# Patient Record
Sex: Male | Born: 1950
Health system: Southern US, Community
[De-identification: ages and names within clinical notes are randomized; demographics above are authoritative.]

## PROBLEM LIST (undated history)

## (undated) DIAGNOSIS — D649 Anemia, unspecified: Secondary | ICD-10-CM

## (undated) DIAGNOSIS — J189 Pneumonia, unspecified organism: Secondary | ICD-10-CM

## (undated) DIAGNOSIS — N189 Chronic kidney disease, unspecified: Secondary | ICD-10-CM

## (undated) DIAGNOSIS — L409 Psoriasis, unspecified: Secondary | ICD-10-CM

## (undated) DIAGNOSIS — Z87442 Personal history of urinary calculi: Secondary | ICD-10-CM

## (undated) DIAGNOSIS — N200 Calculus of kidney: Secondary | ICD-10-CM

## (undated) HISTORY — DX: Anemia, unspecified: D64.9

## (undated) HISTORY — PX: HAND SURGERY: SHX662

## (undated) HISTORY — DX: Chronic kidney disease, unspecified: N18.9

## (undated) HISTORY — DX: Psoriasis, unspecified: L40.9

---

## 2001-08-03 ENCOUNTER — Ambulatory Visit (HOSPITAL_COMMUNITY): Admission: RE | Admit: 2001-08-03 | Discharge: 2001-08-03 | Payer: Self-pay | Admitting: Family Medicine

## 2001-08-03 ENCOUNTER — Encounter: Payer: Self-pay | Admitting: Family Medicine

## 2001-10-18 ENCOUNTER — Ambulatory Visit (HOSPITAL_COMMUNITY): Admission: RE | Admit: 2001-10-18 | Discharge: 2001-10-18 | Payer: Self-pay | Admitting: Family Medicine

## 2001-10-18 ENCOUNTER — Encounter: Payer: Self-pay | Admitting: Family Medicine

## 2001-12-05 ENCOUNTER — Ambulatory Visit (HOSPITAL_COMMUNITY): Admission: RE | Admit: 2001-12-05 | Discharge: 2001-12-05 | Payer: Self-pay | Admitting: Pulmonary Disease

## 2001-12-21 ENCOUNTER — Emergency Department (HOSPITAL_COMMUNITY): Admission: EM | Admit: 2001-12-21 | Discharge: 2001-12-21 | Payer: Self-pay | Admitting: Emergency Medicine

## 2003-09-17 ENCOUNTER — Ambulatory Visit (HOSPITAL_COMMUNITY): Admission: RE | Admit: 2003-09-17 | Discharge: 2003-09-17 | Payer: Self-pay | Admitting: Family Medicine

## 2003-09-17 ENCOUNTER — Encounter: Payer: Self-pay | Admitting: Family Medicine

## 2004-02-18 ENCOUNTER — Ambulatory Visit (HOSPITAL_COMMUNITY): Admission: RE | Admit: 2004-02-18 | Discharge: 2004-02-18 | Payer: Self-pay | Admitting: Family Medicine

## 2004-07-14 ENCOUNTER — Ambulatory Visit (HOSPITAL_COMMUNITY): Admission: RE | Admit: 2004-07-14 | Discharge: 2004-07-14 | Payer: Self-pay | Admitting: Family Medicine

## 2006-09-25 ENCOUNTER — Emergency Department (HOSPITAL_COMMUNITY): Admission: EM | Admit: 2006-09-25 | Discharge: 2006-09-25 | Payer: Self-pay | Admitting: Emergency Medicine

## 2006-09-27 ENCOUNTER — Ambulatory Visit (HOSPITAL_COMMUNITY): Admission: RE | Admit: 2006-09-27 | Discharge: 2006-09-27 | Payer: Self-pay | Admitting: Family Medicine

## 2007-01-19 ENCOUNTER — Emergency Department (HOSPITAL_COMMUNITY): Admission: EM | Admit: 2007-01-19 | Discharge: 2007-01-19 | Payer: Self-pay | Admitting: Emergency Medicine

## 2007-02-26 ENCOUNTER — Emergency Department (HOSPITAL_COMMUNITY): Admission: EM | Admit: 2007-02-26 | Discharge: 2007-02-26 | Payer: Self-pay | Admitting: Emergency Medicine

## 2008-08-28 ENCOUNTER — Ambulatory Visit (HOSPITAL_COMMUNITY): Admission: RE | Admit: 2008-08-28 | Discharge: 2008-08-28 | Payer: Self-pay | Admitting: Urology

## 2011-01-18 ENCOUNTER — Encounter: Payer: Self-pay | Admitting: Family Medicine

## 2011-04-10 ENCOUNTER — Telehealth: Payer: Self-pay

## 2011-04-10 NOTE — Telephone Encounter (Signed)
Take 1/2 metformin day before procedure Hold metformin day of procedure to take afterwards Bring all meds to hospital

## 2011-04-10 NOTE — Telephone Encounter (Signed)
Gastroenterology Pre-Procedure Form  Request Date: 04/02/2011,  Requesting Physician: Dr. Renard Matter     PATIENT INFORMATION:  Jacob Maldonado is a 60 y.o., male (DOB=[birth year]).  PROCEDURE: Procedure(s) requested: colonoscopy Procedure Reason: screening for colon cancer  PATIENT REVIEW QUESTIONS: The patient reports the following:   1. Diabetes Melitis: Yes 2. Joint replacements in the past 12 months: no 3. Major health problems in the past 3 months: no 4. Has an artificial valve or MVP:no 5. Has been advised in past to take antibiotics in advance of a procedure like teeth cleaning: no}    MEDICATIONS & ALLERGIES:    Patient reports the following regarding taking any blood thinners:   Plavix? no Aspirin?no Coumadin?  no  Patient confirms/reports the following medications:  Current Outpatient Prescriptions  Medication Sig Dispense Refill  . acitretin (SORIATANE) 25 MG capsule Take 25 mg by mouth 2 (two) times daily.        Marland Kitchen allopurinol (ZYLOPRIM) 100 MG tablet Take 100 mg by mouth daily.        . metFORMIN (GLUMETZA) 500 MG (MOD) 24 hr tablet Take 500 mg by mouth 2 (two) times daily with a meal.          Patient confirms/reports the following allergies:  Allergies  Allergen Reactions  . Penicillins Hives    Patient is appropriate to schedule for requested procedure(s): yes  AUTHORIZATION INFORMATION Primary Insurance: Medicare    ID #: [id number] Pre-Cert / Berkley Harvey required:   Secondary Insurance: BCBS,  ID #: [id number]  Pre-Cert / Auth required:  Pre-Cert / Auth #:  No orders of the defined types were placed in this encounter.    SCHEDULE INFORMATION: Procedure has been scheduled as follows:  Date: 04/29/2011, Time: 9:15 AM  Location: [facility] Short Stay  This Gastroenterology Pre-Precedure Form is being routed to the following provider(s) for review: Lorenza Burton, NP e

## 2011-04-13 NOTE — Telephone Encounter (Signed)
Gastroenterology Pre-Procedure Form  Request Date: 04/02/2011   Requesting Physician: Dr. Renard Matter     PATIENT INFORMATION:  Jacob Maldonado is a 60 y.o., male (DOB=12/21/1951).  PROCEDURE: Procedure(s) requested: colonoscopy Procedure Reason: screening for colon cancer  PATIENT REVIEW QUESTIONS: The patient reports the following:   1. Diabetes Melitis: Yes 2. Joint replacements in the past 12 months: no 3. Major health problems in the past 3 months: no 4. Has an artificial valve or MVP:no 5. Has been advised in past to take antibiotics in advance of a procedure like teeth cleaning: no}    MEDICATIONS & ALLERGIES:    Patient reports the following regarding taking any blood thinners:   Plavix? no Aspirin ? No  Coumadin?  no  Patient confirms/reports the following medications:  Current Outpatient Prescriptions  Medication Sig Dispense Refill  . acitretin (SORIATANE) 25 MG capsule Take 25 mg by mouth 2 (two) times daily.        Marland Kitchen allopurinol (ZYLOPRIM) 100 MG tablet Take 100 mg by mouth daily.        . metFORMIN (GLUMETZA) 500 MG (MOD) 24 hr tablet Take 500 mg by mouth 2 (two) times daily with a meal.          Patient confirms/reports the following allergies:  Allergies  Allergen Reactions  . Penicillins Hives    Patient is appropriate to schedule for requested procedure(s): yes  AUTHORIZATION INFORMATION Primary Insurance: BCBS,  ID #: [id number]  Pre-Cert / Auth required:  Pre-Cert / Authp #:     Orders Placed This Encounter  Procedures  . Endoscopy, colon, diagnostic    Standing Status: Future     Number of Occurrences:      Standing Expiration Date: 04/09/2012    Order Specific Question:  Pre-op diagnosis    Answer:  Screening colonoscopy    Order Specific Question:  Pre-op visit required?    Answer:  No [0]    SCHEDULE INFORMATION: Procedure has been scheduled as follows:  Date: 04/29/2011   Time: 9:15 AM Location: [facility] Short Stay  This  Gastroenterology Pre-Precedure Form is being routed to the following provider(s) for review:

## 2011-04-29 ENCOUNTER — Encounter: Payer: Self-pay | Admitting: Internal Medicine

## 2011-05-15 NOTE — Procedures (Signed)
[facility]  Patient:    Jacob Maldonado, Jacob Maldonado Visit Number: [id number] MRN: [id number]          Service Type: OUT Location: RAD Attending Physician:  Alice Reichert Dictated by:   Kari Baars, M.D. Admit Date:  10/18/2001 Discharge Date: 10/18/2001   CC:         Butch Penny, M.D.   Stress Test  PRIMARY CARE PHYSICIAN:  Dr. Butch Penny.  INDICATION FOR PROCEDURE:  Shortness of breath and hypertension and diabetes.  SUBJECTIVE:  Jacob Maldonado is coming to the stress lab at [facility] for evaluation of his shortness of breath.  He has a history of obesity, a history of hypertension and his diabetes.  There are no contraindications to graded exercise testing.  DESCRIPTION OF PROCEDURE:  Jacob Maldonado exercised for 4 minutes and 30 seconds on the Bruce protocol, reaching and sustaining 7.0 METs.  His maximum recorded heart rate was 162, which is 95% of his age-predicted maximal heart rate.  He had no symptoms during exercise, had no electrocardiographic changes suggestive of inducible ischemia and his blood pressure response to exercise was normal.  IMPRESSION: 1. Fair exercise tolerance. 2. No evidence of inducible ischemia. 3. Normal blood pressure response to exercise. 4. No symptoms during exercise. Dictated by:   Kari Baars, M.D. Attending Physician:  Alice Reichert DD:  12/05/01 TD:  12/05/01 Job: [id number]

## 2011-09-08 ENCOUNTER — Other Ambulatory Visit (HOSPITAL_COMMUNITY): Payer: Self-pay | Admitting: Family Medicine

## 2011-09-08 ENCOUNTER — Ambulatory Visit (HOSPITAL_COMMUNITY)
Admission: RE | Admit: 2011-09-08 | Discharge: 2011-09-08 | Disposition: A | Discharge status: Home / Self Care | Payer: Medicare Other | Source: Ambulatory Visit | Attending: Family Medicine | Admitting: Family Medicine

## 2011-09-08 DIAGNOSIS — Z87891 Personal history of nicotine dependence: Secondary | ICD-10-CM

## 2011-09-08 DIAGNOSIS — IMO0001 Reserved for inherently not codable concepts without codable children: Secondary | ICD-10-CM

## 2011-09-08 DIAGNOSIS — R918 Other nonspecific abnormal finding of lung field: Secondary | ICD-10-CM | POA: Insufficient documentation

## 2011-09-08 DIAGNOSIS — R0989 Other specified symptoms and signs involving the circulatory and respiratory systems: Secondary | ICD-10-CM | POA: Insufficient documentation

## 2012-02-03 ENCOUNTER — Other Ambulatory Visit: Payer: Self-pay

## 2012-02-03 ENCOUNTER — Emergency Department (HOSPITAL_COMMUNITY): Payer: Medicare Other

## 2012-02-03 ENCOUNTER — Encounter (HOSPITAL_COMMUNITY): Payer: Self-pay | Admitting: Emergency Medicine

## 2012-02-03 ENCOUNTER — Observation Stay (HOSPITAL_COMMUNITY)
Admission: EM | Admit: 2012-02-03 | Discharge: 2012-02-04 | DRG: 313 | Disposition: A | Discharge status: Home / Self Care | Payer: Medicare Other | Attending: Family Medicine | Admitting: Family Medicine

## 2012-02-03 DIAGNOSIS — K7460 Unspecified cirrhosis of liver: Secondary | ICD-10-CM | POA: Diagnosis present

## 2012-02-03 DIAGNOSIS — I4460 Unspecified fascicular block: Secondary | ICD-10-CM | POA: Diagnosis present

## 2012-02-03 DIAGNOSIS — N189 Chronic kidney disease, unspecified: Secondary | ICD-10-CM | POA: Diagnosis present

## 2012-02-03 DIAGNOSIS — Z87891 Personal history of nicotine dependence: Secondary | ICD-10-CM

## 2012-02-03 DIAGNOSIS — Z880 Allergy status to penicillin: Secondary | ICD-10-CM

## 2012-02-03 DIAGNOSIS — Z6841 Body Mass Index (BMI) 40.0 and over, adult: Secondary | ICD-10-CM

## 2012-02-03 DIAGNOSIS — I517 Cardiomegaly: Secondary | ICD-10-CM | POA: Diagnosis present

## 2012-02-03 DIAGNOSIS — M549 Dorsalgia, unspecified: Secondary | ICD-10-CM | POA: Diagnosis present

## 2012-02-03 DIAGNOSIS — R7989 Other specified abnormal findings of blood chemistry: Secondary | ICD-10-CM | POA: Diagnosis present

## 2012-02-03 DIAGNOSIS — Z23 Encounter for immunization: Secondary | ICD-10-CM

## 2012-02-03 DIAGNOSIS — Z79899 Other long term (current) drug therapy: Secondary | ICD-10-CM

## 2012-02-03 DIAGNOSIS — E119 Type 2 diabetes mellitus without complications: Secondary | ICD-10-CM | POA: Diagnosis present

## 2012-02-03 DIAGNOSIS — R911 Solitary pulmonary nodule: Secondary | ICD-10-CM | POA: Diagnosis present

## 2012-02-03 DIAGNOSIS — J189 Pneumonia, unspecified organism: Secondary | ICD-10-CM | POA: Diagnosis present

## 2012-02-03 DIAGNOSIS — R161 Splenomegaly, not elsewhere classified: Secondary | ICD-10-CM | POA: Diagnosis present

## 2012-02-03 DIAGNOSIS — G8929 Other chronic pain: Secondary | ICD-10-CM | POA: Diagnosis present

## 2012-02-03 DIAGNOSIS — Z87442 Personal history of urinary calculi: Secondary | ICD-10-CM

## 2012-02-03 DIAGNOSIS — G4730 Sleep apnea, unspecified: Secondary | ICD-10-CM | POA: Diagnosis present

## 2012-02-03 DIAGNOSIS — N200 Calculus of kidney: Secondary | ICD-10-CM

## 2012-02-03 DIAGNOSIS — R0789 Other chest pain: Principal | ICD-10-CM | POA: Diagnosis present

## 2012-02-03 DIAGNOSIS — L408 Other psoriasis: Secondary | ICD-10-CM | POA: Diagnosis present

## 2012-02-03 DIAGNOSIS — R079 Chest pain, unspecified: Secondary | ICD-10-CM

## 2012-02-03 HISTORY — DX: Personal history of urinary calculi: Z87.442

## 2012-02-03 HISTORY — DX: Calculus of kidney: N20.0

## 2012-02-03 HISTORY — DX: Pneumonia, unspecified organism: J18.9

## 2012-02-03 LAB — CBC
Hemoglobin: 14 g/dL (ref 13.0–17.0)
MCH: 31 pg (ref 26.0–34.0)
MCHC: 34.1 g/dL (ref 30.0–36.0)
MCV: 90.9 fL (ref 78.0–100.0)
Platelets: 166 10*3/uL (ref 150–400)

## 2012-02-03 LAB — POCT I-STAT TROPONIN I: Troponin i, poc: 0 ng/mL (ref 0.00–0.08)

## 2012-02-03 LAB — BASIC METABOLIC PANEL
CO2: 22 mEq/L (ref 19–32)
Calcium: 9.7 mg/dL (ref 8.4–10.5)
Creatinine, Ser: 1.36 mg/dL — ABNORMAL HIGH (ref 0.50–1.35)
GFR calc non Af Amer: 55 mL/min — ABNORMAL LOW (ref >90)
Glucose, Bld: 358 mg/dL — ABNORMAL HIGH (ref 70–99)
Sodium: 133 mEq/L — ABNORMAL LOW (ref 135–145)

## 2012-02-03 LAB — CARDIAC PANEL(CRET KIN+CKTOT+MB+TROPI)
CK, MB: 1.9 ng/mL (ref 0.3–4.0)
Troponin I: <0.3 ng/mL (ref <0.30)

## 2012-02-03 LAB — GLUCOSE, CAPILLARY

## 2012-02-03 MED ORDER — ONDANSETRON HCL 4 MG/2ML IJ SOLN
4.0000 mg | Freq: Once | INTRAMUSCULAR | Status: AC
  Administered 2012-02-03: 4 mg via INTRAVENOUS
  Filled 2012-02-03: qty 2

## 2012-02-03 MED ORDER — PANTOPRAZOLE SODIUM 40 MG IV SOLR
40.0000 mg | Freq: Once | INTRAVENOUS | Status: AC
  Administered 2012-02-03: 40 mg via INTRAVENOUS
  Filled 2012-02-03: qty 40

## 2012-02-03 MED ORDER — ENOXAPARIN SODIUM 40 MG/0.4ML ~~LOC~~ SOLN
40.0000 mg | SUBCUTANEOUS | Status: DC
  Administered 2012-02-03: 40 mg via SUBCUTANEOUS
  Filled 2012-02-03: qty 0.4

## 2012-02-03 MED ORDER — METFORMIN HCL 500 MG PO TABS
ORAL_TABLET | ORAL | Status: AC
  Filled 2012-02-03: qty 1

## 2012-02-03 MED ORDER — PNEUMOCOCCAL VAC POLYVALENT 25 MCG/0.5ML IJ INJ
0.5000 mL | INJECTION | INTRAMUSCULAR | Status: AC
  Administered 2012-02-04: 0.5 mL via INTRAMUSCULAR
  Filled 2012-02-03: qty 0.5

## 2012-02-03 MED ORDER — IOHEXOL 350 MG/ML SOLN
120.0000 mL | Freq: Once | INTRAVENOUS | Status: AC | PRN
  Administered 2012-02-03: 120 mL via INTRAVENOUS

## 2012-02-03 MED ORDER — ASPIRIN 81 MG PO CHEW
324.0000 mg | CHEWABLE_TABLET | Freq: Once | ORAL | Status: AC
  Administered 2012-02-03: 324 mg via ORAL
  Filled 2012-02-03: qty 4

## 2012-02-03 MED ORDER — MORPHINE SULFATE 4 MG/ML IJ SOLN
4.0000 mg | Freq: Once | INTRAMUSCULAR | Status: AC
  Administered 2012-02-03: 4 mg via INTRAVENOUS
  Filled 2012-02-03: qty 1

## 2012-02-03 MED ORDER — ALLOPURINOL 100 MG PO TABS
100.0000 mg | ORAL_TABLET | Freq: Every day | ORAL | Status: DC
  Administered 2012-02-03 – 2012-02-04 (×2): 100 mg via ORAL
  Filled 2012-02-03 (×2): qty 1

## 2012-02-03 MED ORDER — SODIUM CHLORIDE 0.9 % IV SOLN
INTRAVENOUS | Status: DC
  Administered 2012-02-03 (×2): via INTRAVENOUS

## 2012-02-03 MED ORDER — ACITRETIN 25 MG PO CAPS
25.0000 mg | ORAL_CAPSULE | Freq: Two times a day (BID) | ORAL | Status: DC
  Filled 2012-02-03: qty 1

## 2012-02-03 MED ORDER — LISINOPRIL 10 MG PO TABS
20.0000 mg | ORAL_TABLET | Freq: Every day | ORAL | Status: DC
  Administered 2012-02-03 – 2012-02-04 (×2): 20 mg via ORAL
  Filled 2012-02-03 (×2): qty 2

## 2012-02-03 MED ORDER — METFORMIN HCL ER 500 MG PO TB24: 500.0000 mg | ORAL_TABLET | Freq: Two times a day (BID) | ORAL | Status: DC

## 2012-02-03 MED ORDER — INSULIN ASPART 100 UNIT/ML ~~LOC~~ SOLN
0.0000 [IU] | Freq: Every day | SUBCUTANEOUS | Status: DC
  Administered 2012-02-03: 3 [IU] via SUBCUTANEOUS

## 2012-02-03 MED ORDER — INFLUENZA VIRUS VACC SPLIT PF IM SUSP
0.5000 mL | INTRAMUSCULAR | Status: AC
  Administered 2012-02-04: 0.5 mL via INTRAMUSCULAR
  Filled 2012-02-03: qty 0.5

## 2012-02-03 MED ORDER — ACETAMINOPHEN 325 MG PO TABS
650.0000 mg | ORAL_TABLET | ORAL | Status: DC | PRN
  Administered 2012-02-03 – 2012-02-04 (×3): 650 mg via ORAL
  Filled 2012-02-03 (×3): qty 2

## 2012-02-03 MED ORDER — MORPHINE SULFATE 2 MG/ML IJ SOLN: 2.0000 mg | INTRAMUSCULAR | Status: DC | PRN

## 2012-02-03 MED ORDER — ONDANSETRON HCL 4 MG/2ML IJ SOLN: 4.0000 mg | Freq: Four times a day (QID) | INTRAMUSCULAR | Status: DC | PRN

## 2012-02-03 MED ORDER — INSULIN ASPART 100 UNIT/ML ~~LOC~~ SOLN
0.0000 [IU] | Freq: Three times a day (TID) | SUBCUTANEOUS | Status: DC
  Administered 2012-02-03: 8 [IU] via SUBCUTANEOUS
  Administered 2012-02-03: 11 [IU] via SUBCUTANEOUS
  Administered 2012-02-04: 8 [IU] via SUBCUTANEOUS
  Administered 2012-02-04 (×2): 11 [IU] via SUBCUTANEOUS
  Filled 2012-02-03: qty 3

## 2012-02-03 MED ORDER — METFORMIN HCL ER 500 MG PO TB24
500.0000 mg | ORAL_TABLET | Freq: Two times a day (BID) | ORAL | Status: DC
  Filled 2012-02-03: qty 1

## 2012-02-03 MED ORDER — BENZONATATE 100 MG PO CAPS
ORAL_CAPSULE | ORAL | Status: AC
  Administered 2012-02-03: 100 mg
  Filled 2012-02-03: qty 1

## 2012-02-03 NOTE — Progress Notes (Signed)
Inpatient Diabetes Program Recommendations  AACE/ADA: New Consensus Statement on Inpatient Glycemic Control (2009)  Target Ranges:  Prepandial:   less than 140 mg/dL      Peak postprandial:   less than 180 mg/dL (1-2 hours)      Critically ill patients:  140 - 180 mg/dL   Reason for Visit: Elevated lab glucose: 358 mg/dL  Inpatient Diabetes Program Recommendations Insulin - Basal: Consider adding basal insulin Correction (SSI): Add Novolog Resistant Correction HgbA1C: Check HgbA1C to assess glycemic control

## 2012-02-03 NOTE — ED Notes (Signed)
Patient complaining of chest pain radiating into his back x 1 week, gotten worse tonight.

## 2012-02-03 NOTE — ED Notes (Signed)
Pt denies pain at this time. Pt states intermittent, sharp pains to chest x 1 weeks. States "it doesn't hurt during the day" Pt states pain starts usually upon lying down. States pain became worse last night, so bad that it was difficult to take a deep breath.nonproductive Cough present which pt said didn't begin until arrival to ED.

## 2012-02-03 NOTE — ED Provider Notes (Signed)
History     CSN: [id number]  Arrival date & time 02/03/12  0331   First MD Initiated Contact with Patient 02/03/12 [phone]      Chief Complaint  Patient presents with  . Chest Pain    (Consider location/radiation/quality/duration/timing/severity/associated sxs/prior treatment) The history is provided by the patient.  pain woke patient from sleep tonight. Sharp in quality located substernal and radiates to mid back. Seemed to be positional and worse with lying flat, improved with sitting up but still present. She has had a persistent drycough for over a month and diagnosed with bronchitis by his doctor. he's been taking over-the-counter medications with no relief of those symptoms and he does not feel like they're related to his chest pain tonight. No fever chills. No nausea or vomiting. No diaphoresis. Some shortness of breath.no productive cough.moderate in severity. No other associated symptoms. No known history of heart disease.  Past Medical History  Diagnosis Date  . Diabetes mellitus     History reviewed. No pertinent past surgical history.  History reviewed. No pertinent family history.  History  Substance Use Topics  . Smoking status: Former Games developer  . Smokeless tobacco: Not on file  . Alcohol Use: No      Review of Systems  Constitutional: Negative for fever and chills.  HENT: Negative for neck pain and neck stiffness.   Eyes: Negative for pain.  Respiratory: Positive for cough. Negative for wheezing.   Cardiovascular: Positive for chest pain. Negative for palpitations and leg swelling.  Gastrointestinal: Negative for abdominal pain.  Genitourinary: Negative for dysuria.  Musculoskeletal: Negative for back pain.  Skin: Negative for rash.  Neurological: Negative for headaches.  All other systems reviewed and are negative.    Allergies  Penicillins  Home Medications   Current Outpatient Rx  Name Route Sig Dispense Refill  . ACITRETIN 25 MG PO CAPS Oral Take  25 mg by mouth 2 (two) times daily.      . ALLOPURINOL 100 MG PO TABS Oral Take 100 mg by mouth daily.      Marland Kitchen METFORMIN HCL ER (MOD) 500 MG PO TB24 Oral Take 500 mg by mouth 2 (two) times daily with a meal.        BP 129/71  Pulse 73  Temp(Src) 97.9 F (36.6 C) (Oral)  Resp 21  Ht 5\' 10"  (1.778 m)  Wt 400 lb (181.439 kg)  BMI 57.39 kg/m2  SpO2 98%  Physical Exam  Constitutional: He is oriented to person, place, and time. He appears well-developed and well-nourished.  HENT:  Head: Normocephalic and atraumatic.  Eyes: Conjunctivae and EOM are normal. Pupils are equal, round, and reactive to light.  Neck: Trachea normal. Neck supple. No thyromegaly present.  Cardiovascular: Normal rate, regular rhythm, S1 normal, S2 normal and normal pulses.     No systolic murmur is present   No diastolic murmur is present  Pulses:      Radial pulses are 2+ on the right side, and 2+ on the left side.  Pulmonary/Chest: Effort normal and breath sounds normal. He has no wheezes. He has no rhonchi. He has no rales. He exhibits no tenderness.  Abdominal: Soft. Normal appearance and bowel sounds are normal. There is no tenderness. There is no CVA tenderness and negative Murphy's sign.  Musculoskeletal:       BLE:s Calves nontender, no cords or erythema, negative Homans sign  Neurological: He is alert and oriented to person, place, and time. He has normal strength. No cranial  nerve deficit or sensory deficit. GCS eye subscore is 4. GCS verbal subscore is 5. GCS motor subscore is 6.  Skin: Skin is warm and dry. No rash noted. He is not diaphoretic.  Psychiatric: His speech is normal.       Cooperative and appropriate    ED Course  Procedures (including critical care time)  Labs Reviewed  BASIC METABOLIC PANEL - Abnormal; Notable for the following:    Sodium 133 (*)    Glucose, Bld 358 (*)    Creatinine, Ser 1.36 (*)    GFR calc non Af Amer 55 (*)    GFR calc Af Amer 64 (*)    All other components  within normal limits  CBC  PRO B NATRIURETIC PEPTIDE  POCT I-STAT TROPONIN I  D-DIMER, QUANTITATIVE   Chest Portable 1 View  02/03/2012  *RADIOLOGY REPORT*  Clinical Data: Chest pain  PORTABLE CHEST - 1 VIEW  Comparison: 09/08/2011  Findings: Cardiomegaly.  Mild central vascular fullness.  Mild bronchitic changes. Mild lung base scarring versus atelectasis. Otherwise, no focal consolidation.  No pleural effusion or pneumothorax.  Multilevel degenerative changes.  No definite acute osseous abnormality.  IMPRESSION: Cardiomegaly and central vascular congestion.  Bronchitic changes without focal consolidation.  Original Report Authenticated By: Waneta Martins, M.D.     Date: 02/03/2012  Rate: 84  Rhythm: normal sinus rhythm  QRS Axis: left  Intervals: normal  ST/T Wave abnormalities: nonspecific ST changes  Conduction Disutrbances:nonspecific intraventricular conduction delay  Narrative Interpretation:   Old EKG Reviewed: none available  IV morphine and Zofran for pain control. EKG. Chest x-ray. Labs.  5:32 AMIs discussed as above with Dr. Onalee Hua, on call for triad hospitalist, who recommends CT scan at this time. IV fluids initiated and case discussed with radiologist and CT scan was ordered.evaluate for PE/dissection.   7:25 AM d/w DR Hedy Jacob will admit for CP. Currently pain free. CT reviewed with RAD no PE or dissection MDM   Chest pain evaluated as above.         Sunnie Nielsen, MD 02/03/12 [phone]

## 2012-02-03 NOTE — H&P (Signed)
NAME:  Jacob Maldonado, Jacob Maldonado              ACCOUNT NO.:  [id number]  MEDICAL RECORD NO.:  [id number]  LOCATION:  A339                          FACILITY:  [facility]  PHYSICIAN:  Lometa Riggin G. Renard Matter, MD   DATE OF BIRTH:  [birth year]  DATE OF ADMISSION:  02/03/2012 DATE OF DISCHARGE:  LH                             HISTORY & PHYSICAL   This 61 year old male patient presented to the emergency room and was evaluated by ED physician.  He presented to the ED with history of sharp intermittent pain which he felt between his shoulder blades and he states that it radiated around to the anterior part of his chest as well.  This was definitely aggravated by body position and movement.  He apparently had developed some slight cough which actually had been present for over a period of several weeks.  He has had no chills or fever, nausea, vomiting, or diaphoresis.  He has had some slight shortness of breath.  The patient did have a chest x-ray and CT angio of the chest.  Chest x-ray showed cardiomegaly with central vascular congestion, bronchitic changes without focal consolidation.  A CT angio showed no main or lobar branch pulmonary arterial filling defect. Lobular liver contour with hypertrophy, left hepatic lobe suggestive of cirrhotic morphology.  Splenomegaly was noted.  The patient did have D- dimer which was 0.30 and proBNP is 78.6.  Troponin 0.00.  The patient was given morphine and this seemed to relieve him.  It was felt he should be admitted as a rule out and for further evaluation.  SOCIAL HISTORY:  The patient was a former cigarette smoker.  Does not smoke now or does use alcohol.  FAMILY HISTORY:  No pertinent family history.  PAST MEDICAL AND SURGICAL HISTORY:  The patient has had no surgery, but does have diabetes mellitus type 2.  Does have hyperuricemia, psoriasis, obesity, previous history of kidney stones.  ALLERGIES:  To PENICILLIN.  REVIEW OF SYSTEMS:  HEENT:  Negative.   CARDIOPULMONARY:  The patient has had cough intermittently.  Has had chest pain which seems to radiate from mid back between scapulae around chest wall.  GI:  No nausea, vomiting, or diarrhea.  GU:  No dysuria or hematuria.  HOME MEDICATION LIST:  Allopurinol 100 mg daily, metformin 500 mg b.i.d., acitretin 25 mg 2 times a day.  PHYSICAL EXAMINATION:  GENERAL:  Alert patient with blood pressure 129/71, pulse 73, temp 97.9.  HEENT:  Eyes, PERRLA.  TM negative.  Oropharynx benign.  NECK:  Supple.  No JVD or thyroid abnormalities.  HEART:  Regular rhythm.  No murmurs.  LUNGS:  Clear to P and A.  ABDOMEN:  Obese but no palpable organs or masses noted.  SPINE:  The patient has paraspinous muscle tenderness between scapulae.  EXTREMITIES:  Free of edema.  NEUROLOGIC:  No focal deficit.  No sensory or motor abnormalities.  ASSESSMENT:  The patient was admitted with back and chest pain, rule out for coronary artery disease.  Does have a history of type 2 diabetes, obesity, hyperuricemia, psoriasis.  PLAN:  To obtain Cardiology consult.  Continue to monitor cardiac markers.  We will obtain, view his hospital MRI of  lumbar spine following regular x-rays of the lumbar spine and obtain Cardiology consult.     Fredrick Geoghegan G. Renard Matter, MD     AGM/MEDQ  D:  02/03/2012  T:  02/03/2012  Job:  [id number]

## 2012-02-03 NOTE — Plan of Care (Signed)
Problem: Food- and Nutrition-Related Knowledge Deficit (NB-1.1) Goal: Nutrition education Formal process to instruct or train a patient/client in a skill or to impart knowledge to help patients/clients voluntarily manage or modify food choices and eating behavior to maintain or improve health.  Outcome: Completed/Met Date Met:  02/03/12 Patient and family very eager to listen to appropriate diet recommendations for diabetes. Asked good questions. Verbalized understanding. Without any further diet/ nutrition related questions at this time. Encouraged patient to contact RD if questions arise.   Comments:  Patient and family very eager to listen to appropriate diet recommendations for diabetes. Asked good questions. Verbalized understanding. Without any further diet/ nutrition related questions at this time. Encouraged patient to contact RD if questions arise.    Patient stated does not check blood sugars at this time. Encouraged patient to check blood sugar regularly.
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
## 2012-02-04 ENCOUNTER — Inpatient Hospital Stay (HOSPITAL_COMMUNITY): Payer: Medicare Other

## 2012-02-04 ENCOUNTER — Encounter (HOSPITAL_COMMUNITY): Payer: Self-pay | Admitting: Adult Health

## 2012-02-04 DIAGNOSIS — E119 Type 2 diabetes mellitus without complications: Secondary | ICD-10-CM | POA: Diagnosis present

## 2012-02-04 DIAGNOSIS — N200 Calculus of kidney: Secondary | ICD-10-CM

## 2012-02-04 DIAGNOSIS — J189 Pneumonia, unspecified organism: Secondary | ICD-10-CM | POA: Diagnosis present

## 2012-02-04 HISTORY — DX: Calculus of kidney: N20.0

## 2012-02-04 LAB — GLUCOSE, CAPILLARY: Glucose-Capillary: 320 mg/dL — ABNORMAL HIGH (ref 70–99)

## 2012-02-04 LAB — HEPATIC FUNCTION PANEL
AST: 35 U/L (ref 0–37)
Albumin: 3.1 g/dL — ABNORMAL LOW (ref 3.5–5.2)
Bilirubin, Direct: 0.2 mg/dL (ref 0.0–0.3)
Total Bilirubin: 0.7 mg/dL (ref 0.3–1.2)

## 2012-02-04 LAB — LIPID PANEL
HDL: 39 mg/dL — ABNORMAL LOW (ref >39)
Triglycerides: 171 mg/dL — ABNORMAL HIGH (ref <150)

## 2012-02-04 LAB — URIC ACID: Uric Acid, Serum: 6.9 mg/dL (ref 4.0–7.8)

## 2012-02-04 LAB — HEMOGLOBIN A1C
Hgb A1c MFr Bld: 11.7 % — ABNORMAL HIGH (ref <5.7)
Mean Plasma Glucose: 289 mg/dL — ABNORMAL HIGH (ref <117)

## 2012-02-04 MED ORDER — LISINOPRIL 20 MG PO TABS: 20.0000 mg | ORAL_TABLET | Freq: Every day | ORAL | Status: DC

## 2012-02-04 NOTE — Discharge Summary (Signed)
NAME:  Jacob Maldonado, Jacob Maldonado              ACCOUNT NO.:  [id number]  MEDICAL RECORD NO.:  [id number]  LOCATION:  A339                          FACILITY:  [facility]  PHYSICIAN:  Kadar Chance G. Renard Matter, MD   DATE OF BIRTH:  1951/01/10  DATE OF ADMISSION:  02/03/2012 DATE OF DISCHARGE:  02/07/2013LH                              DISCHARGE SUMMARY   DIAGNOSES:  Atypical chest pain, musculoskeletal discomfort, diabetes mellitus type 2, obesity, hyperuricemia, psoriasis.  CONDITION:  Stable and improved at the time of his discharge.  This 61 year old male presented to the emergency room with history of sharp intermittent pain felt between the shoulder blades.  He states that it radiated around to the anterior part of his chest as well.  This was aggravated by body position and movement.  He also had some slight cough, which had been present over a period of several weeks, slight shortness of breath, did have a chest x-ray and CT angio of the chest on admission.  Chest x-ray showed evidence of cardiomegaly and central vascular congestion, bronchitic change without focal consolidation.  CT angio of chest showed no main or lobular right pulmonary arterial filling defect, lobular liver with hypertrophy and left hepatic lobe suggestive of cirrhotic morphology, troponin was 0.00 on admission.  He was admitted to rule out coronary artery disease and for further evaluation.  PHYSICAL EXAMINATION:  GENERAL:  Alert patient. VITAL SIGNS:  Blood pressure 129/71, pulse 73, temperature 97.9. HEENT:  Eyes PERRLA.  TMs negative.  Oropharynx benign. NECK:  Supple.  No JVD, or thyroid abnormalities. HEART:  Regular rhythm.  No murmurs. LUNGS:  Clear to P and A. ABDOMEN:  Obese, but no palpable organs or masses noted.  Spine, the patient has paraspinous muscle tenderness between scapulae. NEUROLOGIC:  No focal deficit.  LABORATORY DATA:  CBC on admission, WBC 8400 with a hemoglobin 14.0, hematocrit 41.0.  Lipid panel,  cholesterol 137, triglycerides 171, HDL 39, LDL 64.  Chemistry profile, sodium 133, potassium 4.1, chloride 99, CO2 of 22, glucose 289, BUN 18, creatinine 0.36, calcium 9.7, GFR 55, alkaline phosphatase 92, albumin 3.1, uric acid 6.9, AST 35, ALT 36, total protein 7.1, direct bilirubin 0.2, indirect 0.5, total bilirubin 0.7.  CPK-MB on February 03, 2012, 1.9, CPK total 49, troponin less than 0.30.  Pro-B natriuretic 78.6.  D-dimer 0.30.  Hemoglobin A1c 11.7, glucose 306, 358.  RADIOLOGY:  CT angio, lobular liver contour with hypertrophy of the lateral segment of left hepatic lobe suggest cirrhotic morphology. Splenomegaly noted.  No main or lobular branch pulmonary arterial defect.  Heart size mildly enlarged.  Chest x-ray, cardiomegaly with central vascular congestion.  Bronchitic change without focal consolidation.  HOSPITAL COURSE:  At the time of the patient's admission, was placed on IV normal saline at 20 mL per hour.  He was continued on the following medications.  Allopurinol 100 mg daily.  He is given Lovenox injection 40 mg subcutaneous every 24 hours, and continued on metformin 500 mg b.i.d., was continued on insulin 11 units subcutaneously t.i.d., placed on modified carbohydrate diet.  The patient was monitored for cardiac problems with the cardiac panel, which remained normal.  He was seen in consultation by  Cardiology, [facility].  EKG was noted to be unremarkable.  He did not feel this was cardiac chest pain.  He did feel he had multiple risk factors for coronary artery disease including morbid obesity, possible sleep apnea, diabetes.  He felt his discomfort was musculoskeletal related to his spine and recommended CT in 3 months to follow up for pulmonary nodules.  He felt that he had chronic kidney disease and diabetes and need to be on ACE inhibitor.  The patient remained symptom-free, did receive morphine during his hospital stay for severe pain, but gradually  ambulated.  He was discharged on following medications: 1. Zyloprim 100 mg daily. 2. Metformin 500 mg daily. 3. Prinivil 20 mg daily. 4. Allopurinol 100 mg daily.  It was felt his blood sugar should be closely monitored.  The patient was stable at the time of his discharge.     [name], MD     AGM/MEDQ  D:  02/04/2012  T:  02/04/2012  Job:  [id number]

## 2012-02-04 NOTE — Progress Notes (Signed)
NAME:  Jacob Maldonado, Jacob Maldonado              ACCOUNT NO.:  [id number]  MEDICAL RECORD NO.:  [id number]  LOCATION:  A339                          FACILITY:  [facility]  PHYSICIAN:  Samirah Scarpati G. Renard Matter, MD   DATE OF BIRTH:  1951-07-03  DATE OF PROCEDURE: DATE OF DISCHARGE:                                PROGRESS NOTE   This patient feels fairly comfortable this morning.  He does still have a slight cough.  Does have some occasional pain between the scapulae. He was seen by Cardiology yesterday.  His cardiac panel remain normal. He does have a left hepatic lobe suggestive of cirrhotic morphology.  OBJECTIVE:  VITAL SIGNS:  Blood pressure 149/83, respirations 18, and pulse 64, temp 97.5. LUNGS:  Occasional rhonchus heard over lower lung field. HEART:  Regular rhythm. ABDOMEN:  No palpable organs or masses, but obese abdomen.  ASSESSMENT:  The patient was admitted with back and chest pain to rule out coronary artery disease.  He does have type 2 diabetes, obesity, hyperuricemia, and possible early cirrhosis.  PLAN:  To obtain liver panel today.  Start to ambulate.  We will obtain x-ray of the lumbar spine.     Consuello Lassalle G. Renard Matter, MD     AGM/MEDQ  D:  02/04/2012  T:  02/04/2012  Job:  [id number]

## 2012-02-04 NOTE — Progress Notes (Signed)
Dr Ardelle Balls called. States coming to DC patient in a while. Refused Lovenox due to "going home". Case manager in to tell pt how to get a glucose monitor. Dr Ardelle Balls needs to write a prescription for one.

## 2012-02-04 NOTE — Progress Notes (Signed)
Allowed pt to shower. Has been up to chair most of day. States no chest pain. Pain is in mid upper back and radiates to left shoulder at times. Sharp shooting pain when it occurs. Cardiac workup has been normal. States MD said he may go home today.... Pending xray results.

## 2012-02-04 NOTE — Progress Notes (Signed)
Discharged to home with diabetes care instructions. Patient stable on discharge. Prescriptions given and instructions given via talk back method. Pt accompanied to front door and discharged per private vehicle with wife driving pt home.

## 2012-02-04 NOTE — Plan of Care (Signed)
Problem: Discharge Progression Outcomes Goal: Discharge plan in place and appropriate Outcome: Adequate for Discharge Patient discharged home Goal: Other Discharge Outcomes/Goals Outcome: Completed/Met Date Met:  02/04/12 Given DM diet planning and Diabetes care notes. Prescription given for glucometer and supplies.

## 2012-11-11 ENCOUNTER — Other Ambulatory Visit (HOSPITAL_COMMUNITY): Payer: Self-pay | Admitting: Family Medicine

## 2012-11-11 DIAGNOSIS — R911 Solitary pulmonary nodule: Secondary | ICD-10-CM

## 2012-11-15 ENCOUNTER — Ambulatory Visit (HOSPITAL_COMMUNITY)
Admission: RE | Admit: 2012-11-15 | Discharge: 2012-11-15 | Disposition: A | Discharge status: Home / Self Care | Payer: Medicare Other | Source: Ambulatory Visit | Attending: Family Medicine | Admitting: Family Medicine

## 2012-11-15 ENCOUNTER — Other Ambulatory Visit (HOSPITAL_COMMUNITY): Payer: Self-pay | Admitting: Family Medicine

## 2012-11-15 DIAGNOSIS — M549 Dorsalgia, unspecified: Secondary | ICD-10-CM

## 2012-11-15 DIAGNOSIS — R918 Other nonspecific abnormal finding of lung field: Secondary | ICD-10-CM | POA: Insufficient documentation

## 2012-11-15 DIAGNOSIS — R911 Solitary pulmonary nodule: Secondary | ICD-10-CM

## 2012-11-15 DIAGNOSIS — K7689 Other specified diseases of liver: Secondary | ICD-10-CM | POA: Insufficient documentation

## 2012-11-15 MED ORDER — IOHEXOL 300 MG/ML  SOLN
80.0000 mL | Freq: Once | INTRAMUSCULAR | Status: AC | PRN
  Administered 2012-11-15: 80 mL via INTRAVENOUS

## 2014-12-10 ENCOUNTER — Emergency Department (HOSPITAL_COMMUNITY)
Admission: EM | Admit: 2014-12-10 | Discharge: 2014-12-10 | Disposition: A | Discharge status: Home / Self Care | Payer: Medicare HMO | Attending: Emergency Medicine | Admitting: Emergency Medicine

## 2014-12-10 ENCOUNTER — Emergency Department (HOSPITAL_COMMUNITY): Payer: Medicare HMO

## 2014-12-10 ENCOUNTER — Encounter (HOSPITAL_COMMUNITY): Payer: Self-pay

## 2014-12-10 DIAGNOSIS — R7989 Other specified abnormal findings of blood chemistry: Secondary | ICD-10-CM | POA: Diagnosis not present

## 2014-12-10 DIAGNOSIS — R945 Abnormal results of liver function studies: Secondary | ICD-10-CM

## 2014-12-10 DIAGNOSIS — D696 Thrombocytopenia, unspecified: Secondary | ICD-10-CM | POA: Diagnosis not present

## 2014-12-10 DIAGNOSIS — Z87442 Personal history of urinary calculi: Secondary | ICD-10-CM | POA: Insufficient documentation

## 2014-12-10 DIAGNOSIS — N289 Disorder of kidney and ureter, unspecified: Secondary | ICD-10-CM | POA: Diagnosis not present

## 2014-12-10 DIAGNOSIS — Z79899 Other long term (current) drug therapy: Secondary | ICD-10-CM | POA: Diagnosis not present

## 2014-12-10 DIAGNOSIS — Z8701 Personal history of pneumonia (recurrent): Secondary | ICD-10-CM | POA: Insufficient documentation

## 2014-12-10 DIAGNOSIS — Z880 Allergy status to penicillin: Secondary | ICD-10-CM | POA: Insufficient documentation

## 2014-12-10 DIAGNOSIS — E119 Type 2 diabetes mellitus without complications: Secondary | ICD-10-CM | POA: Insufficient documentation

## 2014-12-10 DIAGNOSIS — D508 Other iron deficiency anemias: Secondary | ICD-10-CM | POA: Diagnosis not present

## 2014-12-10 DIAGNOSIS — D649 Anemia, unspecified: Secondary | ICD-10-CM

## 2014-12-10 DIAGNOSIS — R109 Unspecified abdominal pain: Secondary | ICD-10-CM | POA: Diagnosis not present

## 2014-12-10 DIAGNOSIS — R748 Abnormal levels of other serum enzymes: Secondary | ICD-10-CM

## 2014-12-10 LAB — COMPREHENSIVE METABOLIC PANEL
ALBUMIN: 3.5 g/dL (ref 3.5–5.2)
ALT: 62 U/L — ABNORMAL HIGH (ref 0–53)
AST: 80 U/L — AB (ref 0–37)
Alkaline Phosphatase: 104 U/L (ref 39–117)
Anion gap: 14 (ref 5–15)
BILIRUBIN TOTAL: 1.6 mg/dL — AB (ref 0.3–1.2)
BUN: 15 mg/dL (ref 6–23)
CALCIUM: 9 mg/dL (ref 8.4–10.5)
CHLORIDE: 108 meq/L (ref 96–112)
CO2: 19 mEq/L (ref 19–32)
CREATININE: 1.64 mg/dL — AB (ref 0.50–1.35)
GFR calc Af Amer: 50 mL/min — ABNORMAL LOW (ref >90)
GFR calc non Af Amer: 43 mL/min — ABNORMAL LOW (ref >90)
Glucose, Bld: 179 mg/dL — ABNORMAL HIGH (ref 70–99)
Potassium: 4.1 mEq/L (ref 3.7–5.3)
Sodium: 141 mEq/L (ref 137–147)
TOTAL PROTEIN: 7.1 g/dL (ref 6.0–8.3)

## 2014-12-10 LAB — URINALYSIS, ROUTINE W REFLEX MICROSCOPIC
BILIRUBIN URINE: NEGATIVE
Hgb urine dipstick: NEGATIVE
KETONES UR: NEGATIVE mg/dL
LEUKOCYTES UA: NEGATIVE
Nitrite: NEGATIVE
PH: 6 (ref 5.0–8.0)
Protein, ur: NEGATIVE mg/dL
Specific Gravity, Urine: 1.01 (ref 1.005–1.030)
Urobilinogen, UA: 0.2 mg/dL (ref 0.0–1.0)

## 2014-12-10 LAB — CBC WITH DIFFERENTIAL/PLATELET
Basophils Absolute: 0 10*3/uL (ref 0.0–0.1)
Basophils Relative: 0 % (ref 0–1)
Eosinophils Absolute: 0 10*3/uL (ref 0.0–0.7)
Eosinophils Relative: 1 % (ref 0–5)
HEMATOCRIT: 29.5 % — AB (ref 39.0–52.0)
HEMOGLOBIN: 10.3 g/dL — AB (ref 13.0–17.0)
LYMPHS ABS: 0.5 10*3/uL — AB (ref 0.7–4.0)
LYMPHS PCT: 11 % — AB (ref 12–46)
MCH: 32.4 pg (ref 26.0–34.0)
MCHC: 34.9 g/dL (ref 30.0–36.0)
MCV: 92.8 fL (ref 78.0–100.0)
MONO ABS: 0 10*3/uL — AB (ref 0.1–1.0)
MONOS PCT: 1 % — AB (ref 3–12)
NEUTROS ABS: 4.5 10*3/uL (ref 1.7–7.7)
NEUTROS PCT: 88 % — AB (ref 43–77)
Platelets: 58 10*3/uL — ABNORMAL LOW (ref 150–400)
RBC: 3.18 MIL/uL — AB (ref 4.22–5.81)
RDW: 15.9 % — ABNORMAL HIGH (ref 11.5–15.5)
Smear Review: DECREASED
WBC: 5.1 10*3/uL (ref 4.0–10.5)

## 2014-12-10 LAB — LIPASE, BLOOD: LIPASE: 70 U/L — AB (ref 11–59)

## 2014-12-10 LAB — URINE MICROSCOPIC-ADD ON

## 2014-12-10 LAB — LACTIC ACID, PLASMA: Lactic Acid, Venous: 1.7 mmol/L (ref 0.5–2.2)

## 2014-12-10 LAB — TROPONIN I: Troponin I: <0.3 ng/mL (ref <0.30)

## 2014-12-10 MED ORDER — ONDANSETRON HCL 4 MG/2ML IJ SOLN
4.0000 mg | Freq: Once | INTRAMUSCULAR | Status: AC
  Administered 2014-12-10: 4 mg via INTRAVENOUS

## 2014-12-10 MED ORDER — ONDANSETRON HCL 4 MG/2ML IJ SOLN
INTRAMUSCULAR | Status: AC
  Filled 2014-12-10: qty 2

## 2014-12-10 MED ORDER — ONDANSETRON HCL 4 MG/2ML IJ SOLN
4.0000 mg | Freq: Once | INTRAMUSCULAR | Status: AC
  Administered 2014-12-10: 4 mg via INTRAVENOUS
  Filled 2014-12-10: qty 2

## 2014-12-10 MED ORDER — IOHEXOL 300 MG/ML  SOLN
100.0000 mL | Freq: Once | INTRAMUSCULAR | Status: AC | PRN
  Administered 2014-12-10: 100 mL via INTRAVENOUS

## 2014-12-10 MED ORDER — IOHEXOL 300 MG/ML  SOLN
50.0000 mL | Freq: Once | INTRAMUSCULAR | Status: AC | PRN
  Administered 2014-12-10: 50 mL via ORAL

## 2014-12-10 MED ORDER — ONDANSETRON 4 MG PO TBDP: ORAL_TABLET | ORAL | Status: DC

## 2014-12-10 MED ORDER — OXYCODONE-ACETAMINOPHEN 5-325 MG PO TABS: 1.0000 | ORAL_TABLET | Freq: Four times a day (QID) | ORAL | Status: DC | PRN

## 2014-12-10 MED ORDER — HYDROMORPHONE HCL 1 MG/ML IJ SOLN
1.0000 mg | Freq: Once | INTRAMUSCULAR | Status: AC
  Administered 2014-12-10: 1 mg via INTRAVENOUS
  Filled 2014-12-10: qty 1

## 2014-12-10 MED ORDER — METOCLOPRAMIDE HCL 5 MG/ML IJ SOLN
10.0000 mg | Freq: Once | INTRAMUSCULAR | Status: AC
  Administered 2014-12-10: 10 mg via INTRAVENOUS
  Filled 2014-12-10: qty 2

## 2014-12-10 NOTE — ED Notes (Signed)
Pt reports upper abd pain that started approx 0230 this am, states is nauseated but has not vomited

## 2014-12-10 NOTE — ED Notes (Signed)
Patient continues to be nauseated, provided medication per MD request

## 2014-12-10 NOTE — ED Notes (Signed)
Patient is resting comfortably. 

## 2014-12-10 NOTE — ED Provider Notes (Signed)
CSN: [id number]     Arrival date & time 12/10/14  0501 History   First MD Initiated Contact with Patient 12/10/14 0539     Chief Complaint  Patient presents with  . Abdominal Pain     (Consider location/radiation/quality/duration/timing/severity/associated sxs/prior Treatment) Patient is a 63 y.o. male presenting with abdominal pain. The history is provided by the patient.  Abdominal Pain He was awakened at about 3 AM with severe epigastric pain without radiation. Pain has been crampy. Pain was as severe as 10/10 but is now down to 5/10. There is associated nausea without vomiting. He denies fever or chills but did break out in sweats. He denies chest pain, heaviness, tightness, pressure. He denies dyspnea. He had eaten something with cream she is in it prior to going to bed. He has not had any abdominal surgery. Of note, he has had recent voluntary weight loss. He was also recently diagnosed with a yeast infection in the perianal area which she feels is related to his taking canagliflozin.  Past Medical History  Diagnosis Date  . Diabetes mellitus   . Pneumonia   . History of kidney stones   . Kidney stones 02/04/2012   Past Surgical History  Procedure Laterality Date  . Hand surgery     No family history on file. History  Substance Use Topics  . Smoking status: Former Smoker    Quit date: 01/02/1989  . Smokeless tobacco: Not on file  . Alcohol Use: No    Review of Systems  Gastrointestinal: Positive for abdominal pain.  All other systems reviewed and are negative.     Allergies  Penicillins  Home Medications   Prior to Admission medications   Medication Sig Start Date End Date Taking? Authorizing Provider  allopurinol (ZYLOPRIM) 100 MG tablet Take 100 mg by mouth daily.     Yes Historical Provider, MD  canagliflozin (INVOKANA) 100 MG TABS tablet Take 100 mg by mouth.   Yes Historical Provider, MD  lisinopril (PRINIVIL,ZESTRIL) 20 MG tablet Take 1 tablet (20 mg  total) by mouth daily. 02/04/12 12/10/14 Yes Angus Ailene Ravel, MD  methotrexate 2.5 MG tablet Take by mouth once a week.   Yes Historical Provider, MD  Ascorbic Acid (VITAMIN C) 1000 MG tablet Take 1,000 mg by mouth daily. OTC med    Historical Provider, MD  Fiber Complete TABS Take 1 tablet by mouth daily. OTC med    Historical Provider, MD  metFORMIN (GLUMETZA) 500 MG (MOD) 24 hr tablet Take 1,000 mg by mouth 2 (two) times daily with a meal.     Historical Provider, MD   BP 125/88 mmHg  Pulse 69  Temp(Src) 97.8 F (36.6 C) (Oral)  Resp 22  Ht 5\' 11"  (1.803 m)  Wt 277 lb (125.646 kg)  BMI 38.65 kg/m2  SpO2 100% Physical Exam  Nursing note and vitals reviewed.  63 year old male, who appears uncomfortable, but is in no acute distress. Vital signs are significant for mild tachypnea. Oxygen saturation is 100%, which is normal. Head is normocephalic and atraumatic. PERRLA, EOMI. Oropharynx is clear. Neck is nontender and supple without adenopathy or JVD. Back is nontender and there is no CVA tenderness. Lungs are clear without rales, wheezes, or rhonchi. Chest is nontender. Heart has regular rate and rhythm without murmur. Abdomen is soft, flat, with marked tenderness in the epigastric area and right upper quadrant with a plus/minus Murphy sign. There are no masses or hepatosplenomegaly and peristalsis is hypoactive. Extremities have 1+  edema, full range of motion is present. Skin is warm and dry without rash. Neurologic: Mental status is normal, cranial nerves are intact, there are no motor or sensory deficits.  ED Course  Procedures (including critical care time) Labs Review Results for orders placed or performed during the hospital encounter of 12/10/14  Comprehensive metabolic panel  Result Value Ref Range   Sodium 141 137 - 147 mEq/L   Potassium 4.1 3.7 - 5.3 mEq/L   Chloride 108 96 - 112 mEq/L   CO2 19 19 - 32 mEq/L   Glucose, Bld 179 (H) 70 - 99 mg/dL   BUN 15 6 - 23 mg/dL    Creatinine, Ser 1.64 (H) 0.50 - 1.35 mg/dL   Calcium 9.0 8.4 - 10.5 mg/dL   Total Protein 7.1 6.0 - 8.3 g/dL   Albumin 3.5 3.5 - 5.2 g/dL   AST 80 (H) 0 - 37 U/L   ALT 62 (H) 0 - 53 U/L   Alkaline Phosphatase 104 39 - 117 U/L   Total Bilirubin 1.6 (H) 0.3 - 1.2 mg/dL   GFR calc non Af Amer 43 (L) >90 mL/min   GFR calc Af Amer 50 (L) >90 mL/min   Anion gap 14 5 - 15  Lipase, blood  Result Value Ref Range   Lipase 70 (H) 11 - 59 U/L  CBC with Differential  Result Value Ref Range   WBC 5.1 4.0 - 10.5 K/uL   RBC 3.18 (L) 4.22 - 5.81 MIL/uL   Hemoglobin 10.3 (L) 13.0 - 17.0 g/dL   HCT 29.5 (L) 39.0 - 52.0 %   MCV 92.8 78.0 - 100.0 fL   MCH 32.4 26.0 - 34.0 pg   MCHC 34.9 30.0 - 36.0 g/dL   RDW 15.9 (H) 11.5 - 15.5 %   Platelets 58 (L) 150 - 400 K/uL   Neutrophils Relative % 88 (H) 43 - 77 %   Neutro Abs 4.5 1.7 - 7.7 K/uL   Lymphocytes Relative 11 (L) 12 - 46 %   Lymphs Abs 0.5 (L) 0.7 - 4.0 K/uL   Monocytes Relative 1 (L) 3 - 12 %   Monocytes Absolute 0.0 (L) 0.1 - 1.0 K/uL   Eosinophils Relative 1 0 - 5 %   Eosinophils Absolute 0.0 0.0 - 0.7 K/uL   Basophils Relative 0 0 - 1 %   Basophils Absolute 0.0 0.0 - 0.1 K/uL   Smear Review PLATELETS APPEAR DECREASED   Troponin I  Result Value Ref Range   Troponin I <0.30 <0.30 ng/mL  Lactic acid, plasma  Result Value Ref Range   Lactic Acid, Venous 1.7 0.5 - 2.2 mmol/L    Imaging Review Ct Abdomen Pelvis W Contrast  12/10/2014   CLINICAL DATA:  Abdominal pain, unspecified  EXAM: CT ABDOMEN AND PELVIS WITH CONTRAST  TECHNIQUE: Multidetector CT imaging of the abdomen and pelvis was performed using the standard protocol following bolus administration of intravenous contrast.  CONTRAST:  60mL OMNIPAQUE IOHEXOL 300 MG/ML SOLN, 199mL OMNIPAQUE IOHEXOL 300 MG/ML SOLN  COMPARISON:  None.  FINDINGS: BODY WALL: Unremarkable.  LOWER CHEST:  Mild herniation of fat through the esophageal hiatus.  ABDOMEN/PELVIS:  Liver: Diffuse fatty  infiltration of the liver. Cirrhotic liver morphology with surface lobulation, caudate enlargement, and fissure enlargement. No focal mass lesion is identified. Prominent lymph nodes in the deep liver drainage which are usually reactive.  Biliary: Gallbladder distention without calcified stone or surrounding inflammation  Pancreas: Unremarkable.  Spleen: 8 cm thick and  17 cm in AP dimension, consistent with splenomegaly. There is no focal lesion. The splenic enlargement is likely from portal hypertension.  Adrenals: Unremarkable.  Kidneys and ureters: 6 mm stone in the distal right ureter without hydroureter or hydronephrosis. No left ureteral calculus or hydronephrosis. Bilateral renal calculi, left more extensive than right. The largest stone on the left is in the lower pole measuring 8 mm. The largest on the right is in the upper pole measuring 2-3 mm.  Bladder: Unremarkable.  Reproductive: Unremarkable.  Bowel: No obstruction. Negative appendix.  Retroperitoneum: Adenopathy in the deep liver drainage, usually reactive in the setting of chronic liver disease.  Peritoneum: No ascites or pneumoperitoneum.  Vascular: No acute abnormality.  OSSEOUS: No acute abnormalities.  IMPRESSION: 1. Nonobstructing 6 mm stone in the distal right ureter. 2. Extensive bilateral nephrolithiasis. 3. Cirrhosis with portal hypertension.   Electronically Signed   By: Jorje Guild M.D.   On: 12/10/2014 06:55     EKG Interpretation   Date/Time:  Monday December 10 2014 05:12:49 EST Ventricular Rate:  71 PR Interval:  182 QRS Duration: 118 QT Interval:  432 QTC Calculation: 469 R Axis:   -42 Text Interpretation:  Sinus rhythm Nonspecific IVCD with LAD Low voltage,  precordial leads Probable lateral infarct, old Baseline wander in lead(s)  III aVL Left anterior fasicular block When compared with ECG of 02/03/2012,  No significant change was found Confirmed by [facility]  MD, Linus Weckerly ([id number]) on  12/10/2014 5:53:44 AM      MDM    Final diagnoses:  Abdominal pain, unspecified abdominal location  Elevated liver function tests  Elevated lipase  Normochromic normocytic anemia  Thrombocytopenia  Renal insufficiency    Epigastric pain worrisome for biliary colic. Screening labs are obtained and he is given hydromorphone for pain and ondansetron for nausea. CT of the abdomen and pelvis has been ordered. Old records are reviewed and he has no relevant past visits.  CT shows nephrolithiasis, and calculus in the distal right ureter without hydronephrosis. Findings of cirrhosis are also present. Laboratory workup shows anemia, thrombocytopenia, elevated transaminases and bilirubin which probably are secondary to cirrhosis. Elevated lipase is also noted as well as renal insufficiency. CT reported gallbladder was distended. He will be sent for ultrasound to look for evidence of radiolucent calculi. Case is signed out to Dr. Roderic Palau.  Delora Fuel, MD 29/19/16 [id number]

## 2014-12-10 NOTE — Discharge Instructions (Signed)
Follow up with [facility] this week.  Follow up with your family md this week.

## 2014-12-10 NOTE — ED Notes (Signed)
Patient noted to be dry heaving, notified Dr. Roxanne Mins who will place an order for Zofran.

## 2014-12-24 ENCOUNTER — Encounter: Payer: Self-pay | Admitting: Internal Medicine

## 2014-12-25 ENCOUNTER — Ambulatory Visit (HOSPITAL_COMMUNITY)
Admission: RE | Admit: 2014-12-25 | Discharge: 2014-12-25 | Disposition: A | Discharge status: Home / Self Care | Payer: Medicare HMO | Source: Ambulatory Visit | Attending: Family Medicine | Admitting: Family Medicine

## 2014-12-25 ENCOUNTER — Other Ambulatory Visit (HOSPITAL_COMMUNITY): Payer: Self-pay | Admitting: Family Medicine

## 2014-12-25 DIAGNOSIS — R05 Cough: Secondary | ICD-10-CM | POA: Diagnosis not present

## 2014-12-25 DIAGNOSIS — R519 Headache, unspecified: Secondary | ICD-10-CM

## 2014-12-25 DIAGNOSIS — R059 Cough, unspecified: Secondary | ICD-10-CM

## 2014-12-25 DIAGNOSIS — R0602 Shortness of breath: Secondary | ICD-10-CM | POA: Insufficient documentation

## 2014-12-25 DIAGNOSIS — R51 Headache: Secondary | ICD-10-CM

## 2015-01-04 ENCOUNTER — Ambulatory Visit: Payer: Medicare HMO | Admitting: Gastroenterology

## 2015-01-08 ENCOUNTER — Ambulatory Visit (INDEPENDENT_AMBULATORY_CARE_PROVIDER_SITE_OTHER): Payer: Medicare HMO | Admitting: Gastroenterology

## 2015-01-08 ENCOUNTER — Encounter: Payer: Self-pay | Admitting: Gastroenterology

## 2015-01-08 ENCOUNTER — Other Ambulatory Visit: Payer: Self-pay

## 2015-01-08 VITALS — BP 122/62 | HR 96 | Temp 98.2°F | Ht 70.0 in | Wt 273.8 lb

## 2015-01-08 DIAGNOSIS — Z800 Family history of malignant neoplasm of digestive organs: Secondary | ICD-10-CM

## 2015-01-08 DIAGNOSIS — K7460 Unspecified cirrhosis of liver: Secondary | ICD-10-CM

## 2015-01-08 DIAGNOSIS — D649 Anemia, unspecified: Secondary | ICD-10-CM | POA: Insufficient documentation

## 2015-01-08 HISTORY — DX: Anemia, unspecified: D64.9

## 2015-01-08 MED ORDER — PEG 3350-KCL-NA BICARB-NACL 420 G PO SOLR: 4000.0000 mL | ORAL | Status: DC

## 2015-01-08 NOTE — Assessment & Plan Note (Signed)
Query secondary to chronic disease; unable to exclude other etiologies. Check iron and ferritin now. Remote history of hematochezia in the past, otherwise no overt signs of GI bleeding. TCS/EGD as planned.

## 2015-01-08 NOTE — Patient Instructions (Addendum)
Please complete the vaccinations at Dr. Everette Rank' office.   Please complete the blood work today.   We have scheduled you for a colonoscopy and upper endoscopy with Dr. Gala Romney.   Further recommendations after review of blood work.   Please review the attached handout for cirrhosis.  Start taking Metamucil or Benefiber or similar fiber supplement daily as directed. You may take Miralax each evening to every other day for constipation.   STOP methotrexate. Please let your dermatologist know that you are stopping this. Due to cirrhosis, this is not a safe drug to take anymore.   Cirrhosis Cirrhosis is a condition of scarring of the liver which is caused when the liver has tried repairing itself following damage. This damage may come from a previous infection such as one of the forms of hepatitis (usually hepatitis C), or the damage may come from being injured by toxins. The main toxin that causes this damage is alcohol. The scarring of the liver from use of alcohol is irreversible. That means the liver cannot return to normal even though alcohol is not used any more. The main danger of hepatitis C infection is that it may cause long-lasting (chronic) liver disease, and this also may lead to cirrhosis. This complication is progressive and irreversible. CAUSES  Prior to available blood tests, hepatitis C could be contracted by blood transfusions. Since testing of blood has improved, this is now unlikely. This infection can also be contracted through intravenous drug use and the sharing of needles. It can also be contracted through sexual relationships. The injury caused by alcohol comes from too much use. It is not a few drinks that poison the liver, but years of misuse. Usually there will be some signs and symptoms early with scarring of the liver that suggest the development of better habits. Alcohol should never be used while using acetaminophen. A small dose of both taken together may cause  irreversible damage to the liver. HOME CARE INSTRUCTIONS  There is no specific treatment for cirrhosis. However, there are things you can do to avoid making the condition worse.  Rest as needed.  Eat a well-balanced diet. Your caregiver can help you with suggestions.  Vitamin supplements including vitamins A, K, D, and thiamine can help.  A low-salt diet, water restriction, or diuretic medicine may be needed to reduce fluid retention.  Avoid alcohol. This can be extremely toxic if combined with acetaminophen.  Avoid drugs which are toxic to the liver. Some of these include isoniazid, methyldopa, acetaminophen, anabolic steroids (muscle-building drugs), erythromycin, and oral contraceptives (birth control pills). Check with your caregiver to make sure medicines you are presently taking will not be harmful.  Periodic blood tests may be required. Follow your caregiver's advice regarding the timing of these.  Milk thistle is an herbal remedy which does protect the liver against toxins. However, it will not help once the liver has been scarred. SEEK MEDICAL CARE IF:  You have increasing fatigue or weakness.  You develop swelling of the hands, feet, legs, or face.  You vomit bright red blood, or a coffee ground appearing material.  You have blood in your stools, or the stools turn black and tarry.  You have a fever.  You develop loss of appetite, or have nausea and vomiting.  You develop jaundice.  You develop easy bruising or bleeding.  You have worsening of any of the problems you are concerned about. Document Released: 12/14/2005 Document Revised: 03/07/2012 Document Reviewed: 08/01/2008 ExitCare Patient Information 2015 [employer]. This information is not intended to replace advice given to you by your health care provider. Make sure you discuss any questions you have with your health care provider.  

## 2015-01-08 NOTE — Progress Notes (Signed)
Primary Care Physician:  Lanette Hampshire, MD Primary Gastroenterologist:  Dr. Gala Romney   Chief Complaint  Patient presents with  . Abdominal Pain  . Nausea    HPI:   Jacob Maldonado is a 64 y.o. male presenting today at the request of Dr. Everette Rank secondary to cirrhosis. Seen in ED 12/10/14 secondary to upper abdominal pain; CT with contrast revealed cirrhosis with portal hypertension. Patient has never been told he had cirrhosis; this is a new finding.   Notes upper abdominal pain acute onset in Dec 2015.  Episode of vomiting X 2 with improvement after vomiting. Once in awhile will feel a little "twinge" like something is in the LUQ but not painful. Appetite has changed: nothing tastes good anymore. Placed on new diabetes medication. Gave up bread, white potatoes, sweets. Self reports weight loss of at least 30 lbs since Oct 2015. Early satiety. Belching more but no indigestion.   Feels more constipated than normal. Used to have 3-4 runny BMs a day but now may not go all week. Remote history of hematochezia. No NSAIDs on a regular basis. Occasional back pain: takes 800 mg Ibuprofen but very seldom. No aspirin powders.   States he has difficulty remembering people's names on the soap operas. Sometimes forgets what he went for at the grocery store. Present for several years. No lower extremity edema.   Has been taking methotrexate for psoriasis for at least 4-5 years. No ETOH use currently. No ETOH in about 20-25 years. No hx of ETOH abuse. NO prior IV drug use or blood transfusions.   Past Medical History  Diagnosis Date  . Diabetes mellitus   . Pneumonia   . History of kidney stones   . Kidney stones 02/04/2012  . Psoriasis     Past Surgical History  Procedure Laterality Date  . Hand surgery      with skin graft after machine accident    Current Outpatient Prescriptions  Medication Sig Dispense Refill  . canagliflozin (INVOKANA) 100 MG TABS tablet Take 100 mg by mouth.      . Fiber Complete TABS Take 1 tablet by mouth daily. OTC med    . metFORMIN (GLUMETZA) 500 MG (MOD) 24 hr tablet Take 1,000 mg by mouth 2 (two) times daily with a meal.     . methotrexate 2.5 MG tablet Take by mouth once a week.    Marland Kitchen lisinopril (PRINIVIL,ZESTRIL) 20 MG tablet Take 1 tablet (20 mg total) by mouth daily. 30 tablet 5   No current facility-administered medications for this visit.    Allergies as of 01/08/2015 - Review Complete 01/08/2015  Allergen Reaction Noted  . Penicillins Hives 04/10/2011    Family History  Problem Relation Age of Onset  . Colon cancer Brother     early 62s    History   Social History  . Marital Status: Married    Spouse Name: N/A    Number of Children: N/A  . Years of Education: N/A   Occupational History  . Not on file.   Social History Main Topics  . Smoking status: Former Smoker    Quit date: 01/02/1989  . Smokeless tobacco: Not on file  . Alcohol Use: No  . Drug Use: No  . Sexual Activity: No   Other Topics Concern  . Not on file   Social History Narrative    Review of Systems: Gen: see HPI CV: Denies chest pain, heart palpitations, peripheral edema, syncope.  Resp: Denies  shortness of breath at rest or with exertion. Denies wheezing or cough.  GI: see HPI GU : Denies urinary burning, urinary frequency, urinary hesitancy MS: Denies joint pain, muscle weakness, cramps, or limitation of movement.  Derm: Denies rash, itching, dry skin Psych: Denies depression, anxiety, memory loss, and confusion Heme: see HPI.  Physical Exam: BP 122/62 mmHg  Pulse 96  Temp(Src) 98.2 F (36.8 C) (Oral)  Ht 5\' 10"  (1.778 m)  Wt 273 lb 12.8 oz (124.195 kg)  BMI 39.29 kg/m2 General:   Alert and oriented. Pleasant and cooperative. Well-nourished and well-developed.  Head:  Normocephalic and atraumatic. Eyes:  Without icterus, sclera clear and conjunctiva pink.  Ears:  Normal auditory acuity. Nose:  No deformity, discharge,  or  lesions. Mouth:  No deformity or lesions, oral mucosa pink.  Lungs:  Clear to auscultation bilaterally. No wheezes, rales, or rhonchi. No distress.  Heart:  S1, S2 present without murmurs appreciated.  Abdomen:  +BS, soft, large AP diameter, obese. non-tender and non-distended. Difficult to appreciate HSM due to large AP diameter.  Rectal:  Deferred until colonoscopy.  Msk:  Symmetrical without gross deformities. Normal posture. Extremities:  Without  edema. Neurologic:  Alert and  oriented x4;  grossly normal neurologically. Skin:  Intact without significant lesions or rashes. Psych:  Alert and cooperative. Normal mood and affect.  Lab Results  Component Value Date   WBC 5.1 12/10/2014   HGB 10.3* 12/10/2014   HCT 29.5* 12/10/2014   MCV 92.8 12/10/2014   PLT 58* 12/10/2014   Lab Results  Component Value Date   ALT 62* 12/10/2014   AST 80* 12/10/2014   ALKPHOS 104 12/10/2014   BILITOT 1.6* 12/10/2014   Lab Results  Component Value Date   CREATININE 1.64* 12/10/2014   BUN 15 12/10/2014   NA 141 12/10/2014   K 4.1 12/10/2014   CL 108 12/10/2014   CO2 19 12/10/2014   CT abd/pelvis with contrast Dec 2015:  1. Nonobstructing 6 mm stone in the distal right ureter. 2. Extensive bilateral nephrolithiasis. 3. Cirrhosis with portal hypertension.  US abdomen Dec 10, 2014:  IMPRESSION: 1. Hepatomegaly and fatty infiltrative change. Surface nodularity is consistent with known cirrhosis. 2. Gallbladder sludge without evidence of acute cholecystitis.

## 2015-01-08 NOTE — Assessment & Plan Note (Signed)
Without prior colonoscopy. Brother recently diagnosed in his early 30s. Remote history of hematochezia likely benign anorectal source in the setting of constipation. Add fiber supplementation daily and take Miralax prn.   Proceed with TCS with Dr. Gala Romney in near future: the risks, benefits, and alternatives have been discussed with the patient in detail. The patient states understanding and desires to proceed.

## 2015-01-08 NOTE — Assessment & Plan Note (Signed)
64 year old male with new finding of cirrhosis on recent abdominal imaging; it appears he had an acute onset of upper abdominal pain, with resolution after N/V episode in the ED. US abdomen with gallbladder sludge but no stones. Clinically, he is much improved and pain resolved; however, he notes early satiety, lack of taste, and self-reported weight loss of at least 30 lbs since Oct 2015. No PPI currently. Elevated LFTs likely secondary to known cirrhosis. Query NASH as culprit; HOWEVER, patient has been on methotrexate for psoriasis for several years. I have asked him to stop this due to new finding of cirrhosis, elevated LFTs. Unable to exclude drug effect as culprit for cirrhosis as well.   1. Viral markers, iron, ferritin, serologies, INR 2. Hep A/B vaccinations 3. EGD for variceal screening with Dr. Gala Romney. Risks and benefits discussed with stated understanding. 4. [place] in 6 months 5. Stop methotrexate and contact dermatologist for different psoriasis treatment

## 2015-01-09 LAB — PROTIME-INR
INR: 1.36 (ref <1.50)
Prothrombin Time: 16.8 seconds — ABNORMAL HIGH (ref 11.6–15.2)

## 2015-01-09 LAB — FERRITIN: Ferritin: 670 ng/mL — ABNORMAL HIGH (ref 22–322)

## 2015-01-09 NOTE — Progress Notes (Signed)
cc'ed to pcp °

## 2015-01-10 LAB — IGG, IGA, IGM
IgA: 392 mg/dL — ABNORMAL HIGH (ref 68–379)
IgG (Immunoglobin G), Serum: 1440 mg/dL (ref 650–1600)
IgM, Serum: 138 mg/dL (ref 41–251)

## 2015-01-10 LAB — ANA: Anti Nuclear Antibody(ANA): NEGATIVE

## 2015-01-10 LAB — MITOCHONDRIAL ANTIBODIES: MITOCHONDRIAL M2 AB, IGG: 0.39 (ref <0.91)

## 2015-01-10 LAB — HEPATITIS C ANTIBODY: HCV AB: NEGATIVE

## 2015-01-10 LAB — HEPATITIS B SURFACE ANTIGEN: Hepatitis B Surface Ag: NEGATIVE

## 2015-01-10 LAB — IRON: IRON: 61 ug/dL (ref 42–165)

## 2015-01-11 LAB — ANTI-SMOOTH MUSCLE ANTIBODY, IGG: SMOOTH MUSCLE AB: 28 U — AB (ref <20)

## 2015-01-14 ENCOUNTER — Telehealth: Payer: Self-pay

## 2015-01-14 NOTE — Telephone Encounter (Signed)
Pt called wanting to know results from his bloodwork

## 2015-01-15 NOTE — Progress Notes (Signed)
Quick Note:  MELD score 16.  Hep B and C negative. He does not have a viral hepatitis. His anti-smooth muscle antibody was weakly positive at 28, and his ferritin is elevated, which could be non-specific in this setting. He does have moderate transaminitis as noted on labs in Dec 2015. IgG is normal. Working diagnosis of NASH right now, but in this case, could consider transjugular liver biopsy to secure diagnosis and exclude other etiologies (autoimmune). Definitely needs to STOP methotrexate. Continue with procedures as planned. Let me know what he would like to do.   ______

## 2015-01-15 NOTE — Telephone Encounter (Signed)
Pt called again wanting to know about labs

## 2015-01-15 NOTE — Telephone Encounter (Signed)
MELD score 16.  Hep B and C negative. He does not have a viral hepatitis. His anti-smooth muscle antibody was weakly positive at 28, and his ferritin is elevated, which could be non-specific in this setting. He does have moderate transaminitis as noted on labs in Dec 2015. IgG is normal. Working diagnosis of NASH right now, but in this case, could consider transjugular liver biopsy to secure diagnosis and exclude other etiologies (autoimmune). Definitely needs to STOP methotrexate. Continue with procedures as planned. Let me know what he would like to do.

## 2015-01-17 NOTE — Telephone Encounter (Signed)
Pt is aware of results. He wants to think about liver bx and will call back next week and let us know what he wants to do. He has stopped the methotrexate.

## 2015-01-21 NOTE — Telephone Encounter (Signed)
Noted  

## 2015-01-25 ENCOUNTER — Other Ambulatory Visit: Payer: Self-pay

## 2015-01-25 DIAGNOSIS — D62 Acute posthemorrhagic anemia: Secondary | ICD-10-CM

## 2015-01-25 DIAGNOSIS — K7460 Unspecified cirrhosis of liver: Secondary | ICD-10-CM

## 2015-01-25 DIAGNOSIS — R109 Unspecified abdominal pain: Secondary | ICD-10-CM

## 2015-01-25 DIAGNOSIS — Z800 Family history of malignant neoplasm of digestive organs: Secondary | ICD-10-CM

## 2015-01-28 ENCOUNTER — Ambulatory Visit (HOSPITAL_COMMUNITY)
Admission: RE | Admit: 2015-01-28 | Discharge: 2015-01-28 | Disposition: A | Discharge status: Home / Self Care | Payer: Medicare HMO | Source: Ambulatory Visit | Attending: Internal Medicine | Admitting: Internal Medicine

## 2015-01-28 ENCOUNTER — Encounter (HOSPITAL_COMMUNITY)
Admission: RE | Disposition: A | Discharge status: Home / Self Care | Payer: Self-pay | Source: Ambulatory Visit | Attending: Internal Medicine

## 2015-01-28 DIAGNOSIS — R634 Abnormal weight loss: Secondary | ICD-10-CM | POA: Diagnosis not present

## 2015-01-28 DIAGNOSIS — K3189 Other diseases of stomach and duodenum: Secondary | ICD-10-CM | POA: Diagnosis not present

## 2015-01-28 DIAGNOSIS — D122 Benign neoplasm of ascending colon: Secondary | ICD-10-CM | POA: Diagnosis not present

## 2015-01-28 DIAGNOSIS — K5900 Constipation, unspecified: Secondary | ICD-10-CM | POA: Insufficient documentation

## 2015-01-28 DIAGNOSIS — K7460 Unspecified cirrhosis of liver: Secondary | ICD-10-CM

## 2015-01-28 DIAGNOSIS — D121 Benign neoplasm of appendix: Secondary | ICD-10-CM | POA: Insufficient documentation

## 2015-01-28 DIAGNOSIS — K648 Other hemorrhoids: Secondary | ICD-10-CM | POA: Diagnosis not present

## 2015-01-28 DIAGNOSIS — D62 Acute posthemorrhagic anemia: Secondary | ICD-10-CM

## 2015-01-28 DIAGNOSIS — K5730 Diverticulosis of large intestine without perforation or abscess without bleeding: Secondary | ICD-10-CM | POA: Insufficient documentation

## 2015-01-28 DIAGNOSIS — Z1211 Encounter for screening for malignant neoplasm of colon: Secondary | ICD-10-CM | POA: Diagnosis not present

## 2015-01-28 DIAGNOSIS — Z860100 Personal history of colon polyps, unspecified: Secondary | ICD-10-CM | POA: Insufficient documentation

## 2015-01-28 DIAGNOSIS — E119 Type 2 diabetes mellitus without complications: Secondary | ICD-10-CM | POA: Diagnosis not present

## 2015-01-28 DIAGNOSIS — D125 Benign neoplasm of sigmoid colon: Secondary | ICD-10-CM | POA: Insufficient documentation

## 2015-01-28 DIAGNOSIS — Z800 Family history of malignant neoplasm of digestive organs: Secondary | ICD-10-CM | POA: Diagnosis not present

## 2015-01-28 DIAGNOSIS — K2289 Other specified disease of esophagus: Secondary | ICD-10-CM | POA: Insufficient documentation

## 2015-01-28 DIAGNOSIS — R109 Unspecified abdominal pain: Secondary | ICD-10-CM

## 2015-01-28 DIAGNOSIS — K229 Disease of esophagus, unspecified: Secondary | ICD-10-CM

## 2015-01-28 HISTORY — PX: ESOPHAGOGASTRODUODENOSCOPY: SHX5428

## 2015-01-28 HISTORY — PX: COLONOSCOPY: SHX5424

## 2015-01-28 LAB — GLUCOSE, CAPILLARY: GLUCOSE-CAPILLARY: 136 mg/dL — AB (ref 70–99)

## 2015-01-28 SURGERY — COLONOSCOPY
Anesthesia: Moderate Sedation

## 2015-01-28 MED ORDER — MEPERIDINE HCL 100 MG/ML IJ SOLN
INTRAMUSCULAR | Status: AC
  Filled 2015-01-28: qty 2

## 2015-01-28 MED ORDER — STERILE WATER FOR IRRIGATION IR SOLN
Status: DC | PRN
  Administered 2015-01-28: 08:00:00

## 2015-01-28 MED ORDER — MIDAZOLAM HCL 5 MG/5ML IJ SOLN
INTRAMUSCULAR | Status: AC
  Filled 2015-01-28: qty 10

## 2015-01-28 MED ORDER — ONDANSETRON HCL 4 MG/2ML IJ SOLN
INTRAMUSCULAR | Status: AC
  Filled 2015-01-28: qty 2

## 2015-01-28 MED ORDER — LIDOCAINE VISCOUS 2 % MT SOLN
OROMUCOSAL | Status: AC
  Filled 2015-01-28: qty 15

## 2015-01-28 MED ORDER — MEPERIDINE HCL 100 MG/ML IJ SOLN
INTRAMUSCULAR | Status: DC | PRN
  Administered 2015-01-28 (×2): 25 mg via INTRAVENOUS
  Administered 2015-01-28 (×2): 50 mg via INTRAVENOUS

## 2015-01-28 MED ORDER — ONDANSETRON HCL 4 MG/2ML IJ SOLN
INTRAMUSCULAR | Status: DC | PRN
  Administered 2015-01-28: 4 mg via INTRAVENOUS

## 2015-01-28 MED ORDER — MIDAZOLAM HCL 5 MG/5ML IJ SOLN
INTRAMUSCULAR | Status: DC | PRN
  Administered 2015-01-28 (×3): 1 mg via INTRAVENOUS
  Administered 2015-01-28 (×3): 2 mg via INTRAVENOUS
  Administered 2015-01-28: 1 mg via INTRAVENOUS

## 2015-01-28 MED ORDER — LIDOCAINE VISCOUS 2 % MT SOLN
OROMUCOSAL | Status: DC | PRN
  Administered 2015-01-28: 3 mL via OROMUCOSAL

## 2015-01-28 MED ORDER — SODIUM CHLORIDE 0.9 % IV SOLN
INTRAVENOUS | Status: DC
  Administered 2015-01-28: 08:00:00 via INTRAVENOUS

## 2015-01-28 NOTE — Op Note (Signed)
[facility] [address] [place], [address]   COLONOSCOPY PROCEDURE REPORT  PATIENT: Jacob Maldonado, Jacob Maldonado  MR#: [id number] BIRTHDATE: 1951-09-22 , 74  yrs. old GENDER: male ENDOSCOPIST: R.  Garfield Cornea, MD FACP [facility] REFERRED GY:BWLSL Everette Rank, M.D. PROCEDURE DATE:  02/07/2015 PROCEDURE:   Colonoscopy with snare polypectomy INDICATIONS:First ever screening colonoscopy; brother with colorectal cancer. MEDICATIONS: Versed 10 mg IV and Demerol 150 mg IV in divided doses. Xylocaine gel orally.  Zofran 4 mg IV. ASA CLASS:       Class II  CONSENT: The risks, benefits, alternatives and imponderables including but not limited to bleeding, perforation as well as the possibility of a missed lesion have been reviewed.  The potential for biopsy, lesion removal, etc. have also been discussed. Questions have been answered.  All parties agreeable.  Please see the history and physical in the medical record for more information.  DESCRIPTION OF PROCEDURE:   After the risks benefits and alternatives of the procedure were thoroughly explained, informed consent was obtained.  The digital rectal exam revealed no rectal mass.   The EG-2990i ([id number])  endoscope was introduced through the anus and advanced to the terminal ileum which was intubated for a short distance. No adverse events experienced.   The quality of the prep was adequate.  The instrument was then slowly withdrawn as the colon was fully examined.      COLON FINDINGS: Internal hemorrhoids; otherwise, normal rectum. Patient had few scattered narrow mouth left-sided diverticula; (1) 4 mm polyp in the ascending segment and another 4 mm polyp in the mid sigmoid segment; otherwise, the remainder of the colonic mucosa appeared normal.  The distal 10 cm of terminal ileum mucosa also appeared normal.  Retroflexion was performed. .  Withdrawal time=8 minutes 0 seconds.  The scope was withdrawn and the procedure  completed. COMPLICATIONS: There were no immediate complications.  ENDOSCOPIC IMPRESSION: Internal hemorrhoids. Multiple colonic polyps?"removed as described above. Colonic diverticulosis.  RECOMMENDATIONS: Follow up on pathology. For his intermittent constipation, add Benefiber 2 teaspoons twice daily to the regimen. Consider 17 g of MiraLAX at bedtime as needed for constipation  MELD score 16. It would not be unreasonable to have the patient get acquainted with a liver transplant center sometime later this year. I continue to recommend a transjugular liver biopsy.   See EGD report.  eSigned:  R. Garfield Cornea, MD Rosalita Chessman [facility] February 07, 2015 9:07 AM   cc:  CPT CODES: ICD CODES:  The ICD and CPT codes recommended by this software are interpretations from the data that the clinical staff has captured with the software.  The verification of the translation of this report to the ICD and CPT codes and modifiers is the sole responsibility of the health care institution and practicing physician where this report was generated.  [employer] will not be held responsible for the validity of the ICD and CPT codes included on this report.  AMA assumes no liability for data contained or not contained herein. CPT is a Designer, television/film set of the [employer].  PATIENT NAME:  Jacob Maldonado, Jacob Maldonado MR#: [id number]

## 2015-01-28 NOTE — Interval H&P Note (Signed)
History and Physical Interval Note:  01/28/2015 8:05 AM  Jacob Maldonado  has presented today for surgery, with the diagnosis of cirrhosis, family history of colon cancer, anemia, abd pain  The various methods of treatment have been discussed with the patient and family. After consideration of risks, benefits and other options for treatment, the patient has consented to  Procedure(s) with comments: COLONOSCOPY (N/A) - 800am ESOPHAGOGASTRODUODENOSCOPY (EGD) (N/A) as a surgical intervention .  The patient's history has been reviewed, patient examined, no change in status, stable for surgery.  I have reviewed the patient's chart and labs.  Questions were answered to the patient's satisfaction.     Jacob Maldonado  No change. EGD colonoscopy per plan.The risks, benefits, limitations, imponderables and alternatives regarding both EGD and colonoscopy have been reviewed with the patient. Questions have been answered. All parties agreeable.

## 2015-01-28 NOTE — H&P (View-Only) (Signed)
Quick Note:  MELD score 16.  Hep B and C negative. He does not have a viral hepatitis. His anti-smooth muscle antibody was weakly positive at 28, and his ferritin is elevated, which could be non-specific in this setting. He does have moderate transaminitis as noted on labs in Dec 2015. IgG is normal. Working diagnosis of NASH right now, but in this case, could consider transjugular liver biopsy to secure diagnosis and exclude other etiologies (autoimmune). Definitely needs to STOP methotrexate. Continue with procedures as planned. Let me know what he would like to do.   ______

## 2015-01-28 NOTE — Op Note (Signed)
[facility] [address] [place], [address]   ENDOSCOPY PROCEDURE REPORT  PATIENT: Jacob Maldonado, Jacob Maldonado  MR#: [id number] BIRTHDATE: [birth year] , 63  yrs. old GENDER: male ENDOSCOPIST: R.  Garfield Cornea, MD FACP FACG REFERRED BY:  Marjean Donna, M.D. PROCEDURE DATE:  02/11/2015 PROCEDURE:  EGD w/ biopsy INDICATIONS:  New diagnosis cirrhosis.  30+ pound weight loss in 4 months; anorexia?"better with treatment for thrush. MEDICATIONS: Versed 6 mg IV and Demerol 125 mg IV in divided doses. Zofran 4 mg IV.  Xylocaine gel orally. ASA CLASS:      Class II  CONSENT: The risks, benefits, limitations, alternatives and imponderables have been discussed.  The potential for biopsy, esophogeal dilation, etc. have also been reviewed.  Questions have been answered.  All parties agreeable.  Please see the history and physical in the medical record for more information.  DESCRIPTION OF PROCEDURE: After the risks benefits and alternatives of the procedure were thoroughly explained, informed consent was obtained.  The EG-2990i ([id number]) endoscope was introduced through the mouth and advanced to the second portion of the duodenum , limited by Without limitations. The instrument was slowly withdrawn as the mucosa was fully examined.    No esophageal varices.  No esophageal erosions.  Accentuated undulating Z line with 2 cm "tongue" of salmon colored epithelium coming up from the GE junction.  Stomach empty.  Scattered antral erosions.  No portal gastropathy or ulcer, infiltrating process or gastric varices.  Pylorus patent.  Examination bulb and second portion revealed duodenal bulbar erosions only  Biopsies of the abnormal distal esophagus and stomach taken for histologic study.  Retroflexed views revealed no abnormalities. The scope was then withdrawn from the patient and the procedure completed.  COMPLICATIONS: There were no immediate complications.  ENDOSCOPIC  IMPRESSION: Abnormal esophagus?"query short segment Barrett's esophagus?"status post biopsy. No esophageal varices. Gastric and duodenal erosions as described?"status post biopsy.  No stigmata of chronic liver disease found on today's examination.  RECOMMENDATIONS: Follow up on pathology. See colonoscopy report.  REPEAT EXAM:  eSigned:  R. Garfield Cornea, MD Rosalita Chessman [facility] 02-11-15 8:42 AM    CC:  CPT CODES: ICD CODES:  The ICD and CPT codes recommended by this software are interpretations from the data that the clinical staff has captured with the software.  The verification of the translation of this report to the ICD and CPT codes and modifiers is the sole responsibility of the health care institution and practicing physician where this report was generated.  [employer] will not be held responsible for the validity of the ICD and CPT codes included on this report.  AMA assumes no liability for data contained or not contained herein. CPT is a Designer, television/film set of the [employer].  PATIENT NAME:  Jshon, Ibe MR#: [id number]

## 2015-01-28 NOTE — Discharge Instructions (Signed)
Colonoscopy Discharge Instructions  Read the instructions outlined below and refer to this sheet in the next few weeks. These discharge instructions provide you with general information on caring for yourself after you leave the hospital. Your doctor may also give you specific instructions. While your treatment has been planned according to the most current medical practices available, unavoidable complications occasionally occur. If you have any problems or questions after discharge, call Dr. Gala Romney at [phone]. ACTIVITY  You may resume your regular activity, but move at a slower pace for the next 24 hours.   Take frequent rest periods for the next 24 hours.   Walking will help get rid of the air and reduce the bloated feeling in your belly (abdomen).   No driving for 24 hours (because of the medicine (anesthesia) used during the test).    Do not sign any important legal documents or operate any machinery for 24 hours (because of the anesthesia used during the test).  NUTRITION  Drink plenty of fluids.   You may resume your normal diet as instructed by your doctor.   Begin with a light meal and progress to your normal diet. Heavy or fried foods are harder to digest and may make you feel sick to your stomach (nauseated).   Avoid alcoholic beverages for 24 hours or as instructed.  MEDICATIONS  You may resume your normal medications unless your doctor tells you otherwise.  WHAT YOU CAN EXPECT TODAY  Some feelings of bloating in the abdomen.   Passage of more gas than usual.   Spotting of blood in your stool or on the toilet paper.  IF YOU HAD POLYPS REMOVED DURING THE COLONOSCOPY:  No aspirin products for 7 days or as instructed.   No alcohol for 7 days or as instructed.   Eat a soft diet for the next 24 hours.  FINDING OUT THE RESULTS OF YOUR TEST Not all test results are available during your visit. If your test results are not back during the visit, make an appointment  with your caregiver to find out the results. Do not assume everything is normal if you have not heard from your caregiver or the medical facility. It is important for you to follow up on all of your test results.  SEEK IMMEDIATE MEDICAL ATTENTION IF:  You have more than a spotting of blood in your stool.   Your belly is swollen (abdominal distention).   You are nauseated or vomiting.   You have a temperature over 101.  You have abdominal pain or discomfort that is severe or gets worse throughout the day . EGD Discharge instructions Please read the instructions outlined below and refer to this sheet in the next few weeks. These discharge instructions provide you with general information on caring for yourself after you leave the hospital. Your doctor may also give you specific instructions. While your treatment has been planned according to the most current medical practices available, unavoidable complications occasionally occur. If you have any problems or questions after discharge, please call your doctor. ACTIVITY You may resume your regular activity but move at a slower pace for the next 24 hours.  Take frequent rest periods for the next 24 hours.  Walking will help expel (get rid of) the air and reduce the bloated feeling in your abdomen.  No driving for 24 hours (because of the anesthesia (medicine) used during the test).  You may shower.  Do not sign any important legal documents or operate any machinery for  24 hours (because of the anesthesia used during the test).  NUTRITION Drink plenty of fluids.  You may resume your normal diet.  Begin with a light meal and progress to your normal diet.  Avoid alcoholic beverages for 24 hours or as instructed by your caregiver.  MEDICATIONS You may resume your normal medications unless your caregiver tells you otherwise.  WHAT YOU CAN EXPECT TODAY You may experience abdominal discomfort such as a feeling of fullness or gas pains.    FOLLOW-UP Your doctor will discuss the results of your test with you.  SEEK IMMEDIATE MEDICAL ATTENTION IF ANY OF THE FOLLOWING OCCUR: Excessive nausea (feeling sick to your stomach) and/or vomiting.  Severe abdominal pain and distention (swelling).  Trouble swallowing.  Temperature over 101 F (37.8 C).  Rectal bleeding or vomiting of blood.   Constipation Constipation is when a person has fewer than three bowel movements a week, has difficulty having a bowel movement, or has stools that are dry, hard, or larger than normal. As people grow older, constipation is more common. If you try to fix constipation with medicines that make you have a bowel movement (laxatives), the problem may get worse. Long-term laxative use may cause the muscles of the colon to become weak. A low-fiber diet, not taking in enough fluids, and taking certain medicines may make constipation worse.  CAUSES   Certain medicines, such as antidepressants, pain medicine, iron supplements, antacids, and water pills.   Certain diseases, such as diabetes, irritable bowel syndrome (IBS), thyroid disease, or depression.   Not drinking enough water.   Not eating enough fiber-rich foods.   Stress or travel.   Lack of physical activity or exercise.   Ignoring the urge to have a bowel movement.   Using laxatives too much.  SIGNS AND SYMPTOMS   Having fewer than three bowel movements a week.   Straining to have a bowel movement.   Having stools that are hard, dry, or larger than normal.   Feeling full or bloated.   Pain in the lower abdomen.   Not feeling relief after having a bowel movement.  DIAGNOSIS  Your health care provider will take a medical history and perform a physical exam. Further testing may be done for severe constipation. Some tests may include:  A barium enema X-ray to examine your rectum, colon, and, sometimes, your small intestine.   A sigmoidoscopy to examine your lower  colon.   A colonoscopy to examine your entire colon. TREATMENT  Treatment will depend on the severity of your constipation and what is causing it. Some dietary treatments include drinking more fluids and eating more fiber-rich foods. Lifestyle treatments may include regular exercise. If these diet and lifestyle recommendations do not help, your health care provider may recommend taking over-the-counter laxative medicines to help you have bowel movements. Prescription medicines may be prescribed if over-the-counter medicines do not work.  HOME CARE INSTRUCTIONS   Eat foods that have a lot of fiber, such as fruits, vegetables, whole grains, and beans.  Limit foods high in fat and processed sugars, such as french fries, hamburgers, cookies, candies, and soda.   A fiber supplement may be added to your diet if you cannot get enough fiber from foods.   Drink enough fluids to keep your urine clear or pale yellow.   Exercise regularly or as directed by your health care provider.   Go to the restroom when you have the urge to go. Do not hold it.   Only  take over-the-counter or prescription medicines as directed by your health care provider. Do not take other medicines for constipation without talking to your health care provider first.  [place] IF:   You have bright red blood in your stool.   Your constipation lasts for more than 4 days or gets worse.   You have abdominal or rectal pain.   You have thin, pencil-like stools.   You have unexplained weight loss. MAKE SURE YOU:   Understand these instructions.  Will watch your condition.  Will get help right away if you are not doing well or get worse. Document Released: 09/11/2004 Document Revised: 12/19/2013 Document Reviewed: 09/25/2013 [facility] Patient Information 2015 [place], [place]. This information is not intended to replace advice given to you by your health care provider. Make sure you discuss any  questions you have with your health care provider.  Colon Polyps Polyps are lumps of extra tissue growing inside the body. Polyps can grow in the large intestine (colon). Most colon polyps are noncancerous (benign). However, some colon polyps can become cancerous over time. Polyps that are larger than a pea may be harmful. To be safe, caregivers remove and test all polyps. CAUSES  Polyps form when mutations in the genes cause your cells to grow and divide even though no more tissue is needed. RISK FACTORS There are a number of risk factors that can increase your chances of getting colon polyps. They include:  Being older than 50 years.  Family history of colon polyps or colon cancer.  Long-term colon diseases, such as colitis or Crohn disease.  Being overweight.  Smoking.  Being inactive.  Drinking too much alcohol. SYMPTOMS  Most small polyps do not cause symptoms. If symptoms are present, they may include:  Blood in the stool. The stool may look dark red or black.  Constipation or diarrhea that lasts longer than 1 week. DIAGNOSIS People often do not know they have polyps until their caregiver finds them during a regular checkup. Your caregiver can use 4 tests to check for polyps:  Digital rectal exam. The caregiver wears gloves and feels inside the rectum. This test would find polyps only in the rectum.  Barium enema. The caregiver puts a liquid called barium into your rectum before taking X-rays of your colon. Barium makes your colon look white. Polyps are dark, so they are easy to see in the X-ray pictures.  Sigmoidoscopy. A thin, flexible tube (sigmoidoscope) is placed into your rectum. The sigmoidoscope has a light and tiny camera in it. The caregiver uses the sigmoidoscope to look at the last third of your colon.  Colonoscopy. This test is like sigmoidoscopy, but the caregiver looks at the entire colon. This is the most common method for finding and removing  polyps. TREATMENT  Any polyps will be removed during a sigmoidoscopy or colonoscopy. The polyps are then tested for cancer. PREVENTION  To help lower your risk of getting more colon polyps:  Eat plenty of fruits and vegetables. Avoid eating fatty foods.  Do not smoke.  Avoid drinking alcohol.  Exercise every day.  Lose weight if recommended by your caregiver.  Eat plenty of calcium and folate. Foods that are rich in calcium include milk, cheese, and broccoli. Foods that are rich in folate include chickpeas, kidney beans, and spinach. HOME CARE INSTRUCTIONS Keep all follow-up appointments as directed by your caregiver. You may need periodic exams to check for polyps. SEEK MEDICAL CARE IF: You notice bleeding during a bowel  movement. Document Released: 09/09/2004 Document Revised: 03/07/2012 Document Reviewed: 02/23/2012 [facility] Patient Information 2015 [place], [place]. This information is not intended to replace advice given to you by your health care provider. Make sure you discuss any questions you have with your health care provider.   Constipation and polyp information provided  Office visit with [place] in 3 months  Benefiber 2 teaspoons twice daily  MiraLAX 17 g orally at bedtime as needed for constipation  We continue to recommend a liver biopsy to further evaluate your condition  Further recommendations to follow pending review of pathology report

## 2015-01-29 ENCOUNTER — Encounter (HOSPITAL_COMMUNITY): Payer: Self-pay | Admitting: Internal Medicine

## 2015-01-29 ENCOUNTER — Ambulatory Visit: Payer: Medicare Other | Admitting: Gastroenterology

## 2015-01-30 ENCOUNTER — Encounter: Payer: Self-pay | Admitting: Internal Medicine

## 2015-11-06 ENCOUNTER — Ambulatory Visit (HOSPITAL_COMMUNITY)
Admission: RE | Admit: 2015-11-06 | Discharge: 2015-11-06 | Disposition: A | Discharge status: Home / Self Care | Payer: Medicare HMO | Source: Ambulatory Visit | Attending: Family Medicine | Admitting: Family Medicine

## 2015-11-06 ENCOUNTER — Other Ambulatory Visit (HOSPITAL_COMMUNITY): Payer: Self-pay | Admitting: Family Medicine

## 2015-11-06 DIAGNOSIS — M19071 Primary osteoarthritis, right ankle and foot: Secondary | ICD-10-CM | POA: Diagnosis not present

## 2015-11-06 DIAGNOSIS — M21611 Bunion of right foot: Secondary | ICD-10-CM | POA: Diagnosis not present

## 2015-11-06 DIAGNOSIS — M79671 Pain in right foot: Secondary | ICD-10-CM

## 2015-11-06 DIAGNOSIS — M7731 Calcaneal spur, right foot: Secondary | ICD-10-CM | POA: Diagnosis not present

## 2016-06-08 DIAGNOSIS — S161XXA Strain of muscle, fascia and tendon at neck level, initial encounter: Secondary | ICD-10-CM | POA: Diagnosis not present

## 2016-07-29 IMAGING — US US ABDOMEN LIMITED
1 series · 14 of 25 positions shown · non-contrast
Comparison: The CT scan of the abdomen and pelvis of December 10, 2014.

CLINICAL DATA: Right upper quadrant pain for the past 6 hr
associated with nausea and vomiting; history of hepatic cirrhosis,
portal hypertension, an urinary tract stones

EXAM:
US ABDOMEN LIMITED - RIGHT UPPER QUADRANT

[Series 1: us abdomen limited · 0.26mm/px · 14 of 44 slices shown]
[im 1/44]
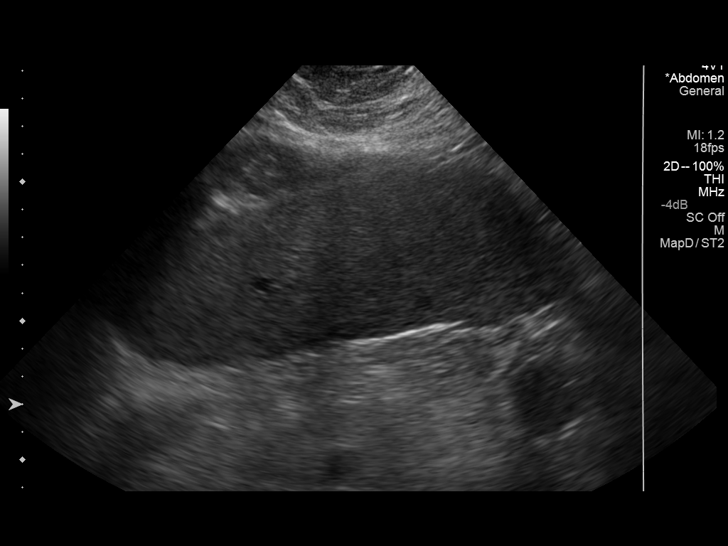
[im 4/44]
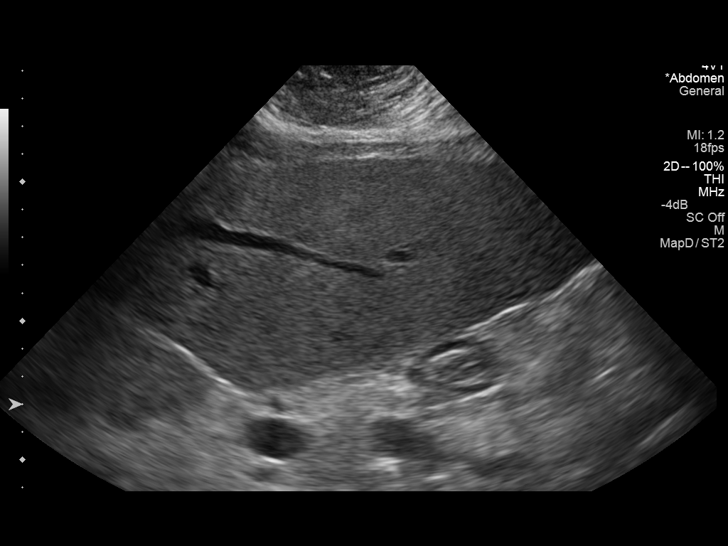
[im 8/44]
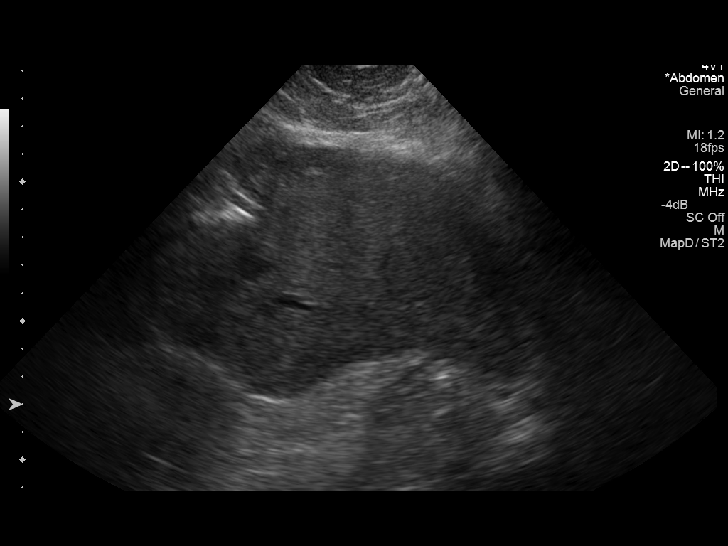
[im 11/44]
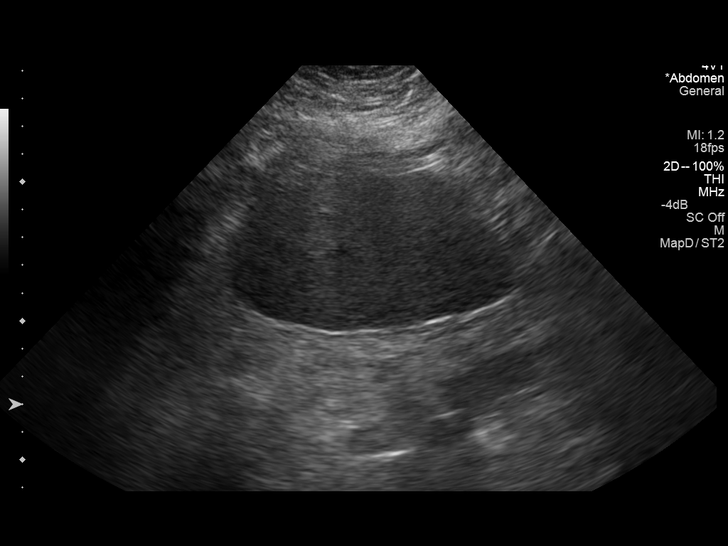
[im 15/44]
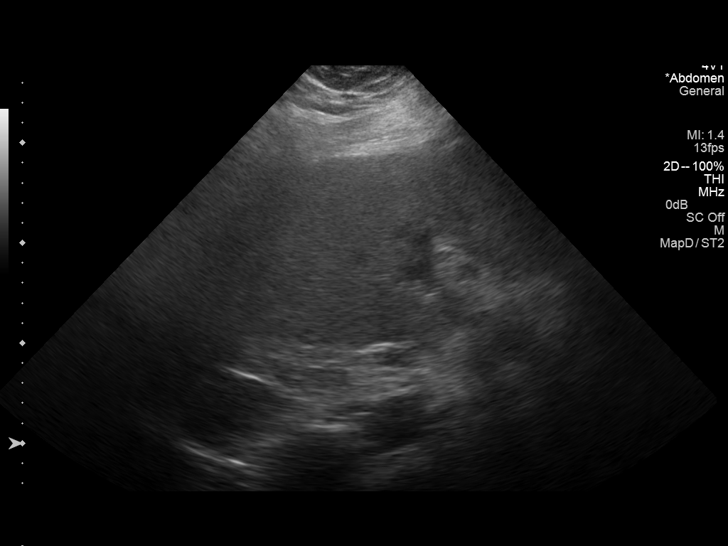
[im 17/44]
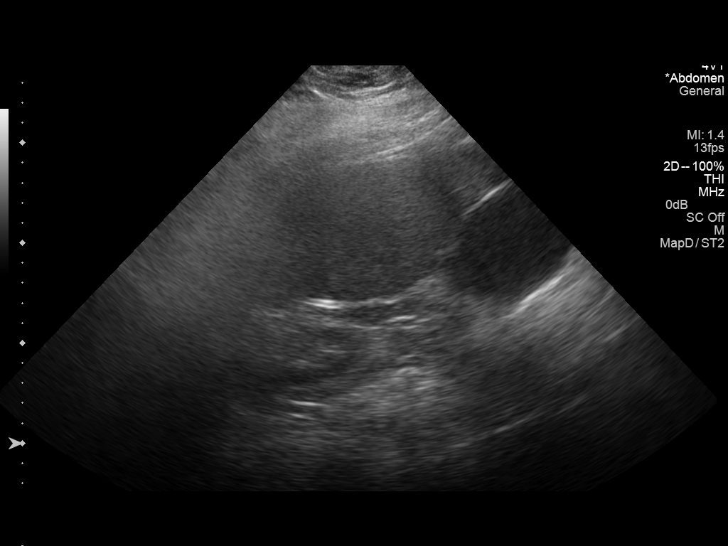
[im 20/44]
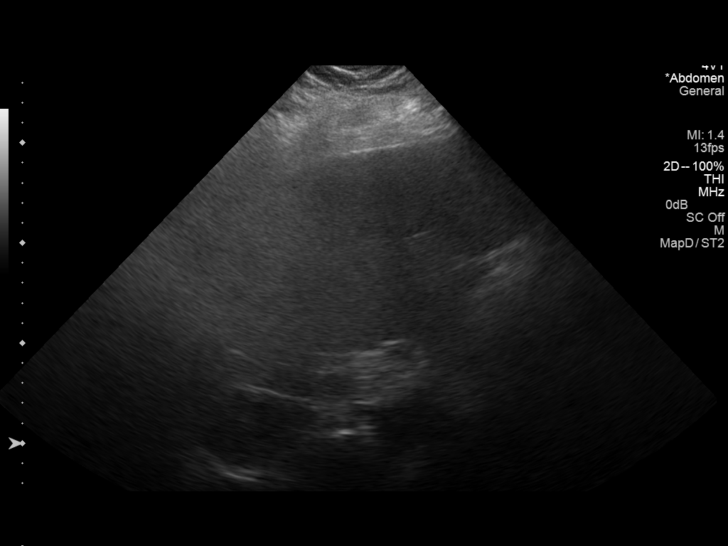
[im 24/44]
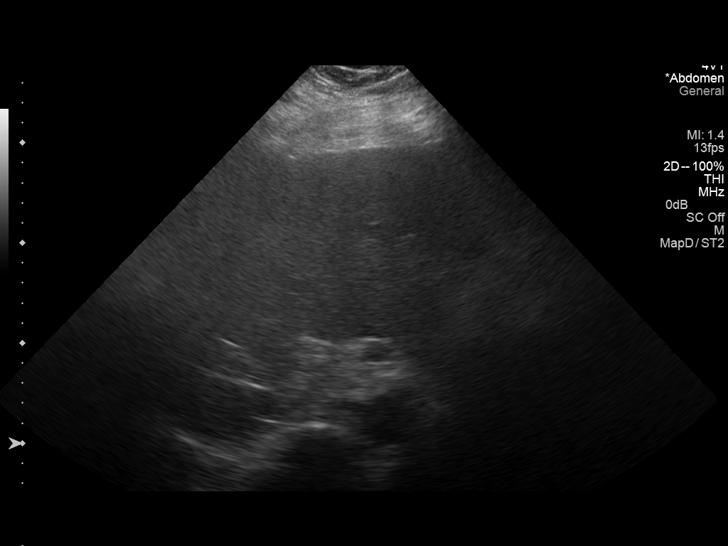
[im 27/44]
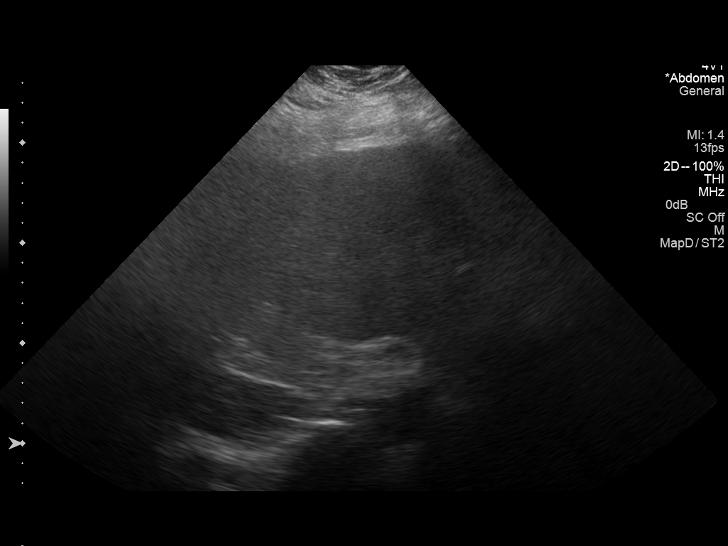
[im 29/44]
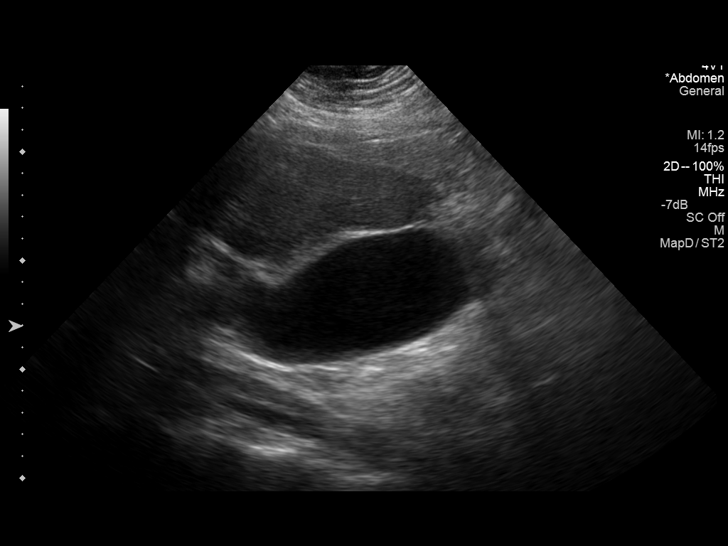
[im 33/44]
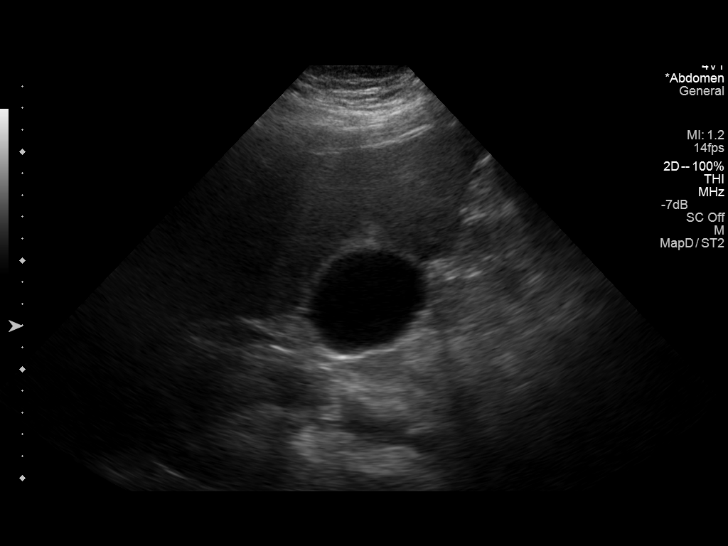
[im 36/44]
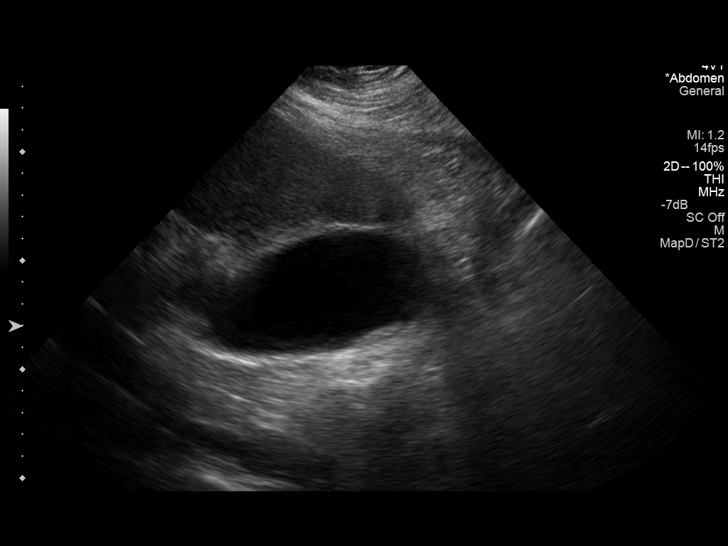
[im 40/44]
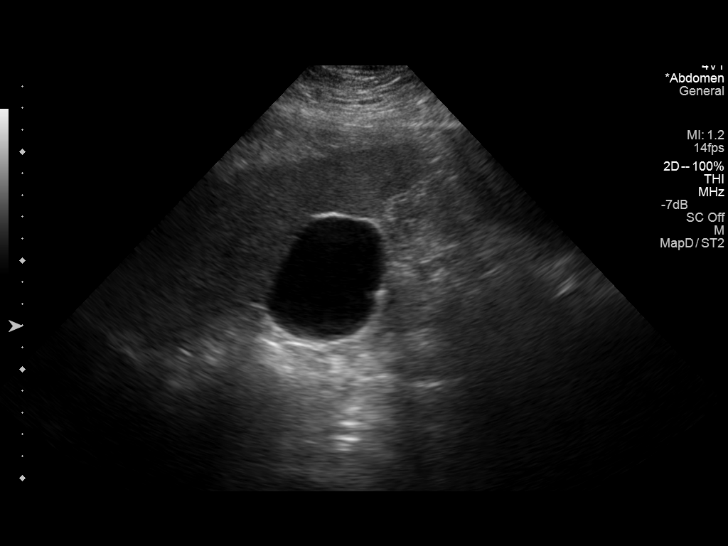
[im 44/44]
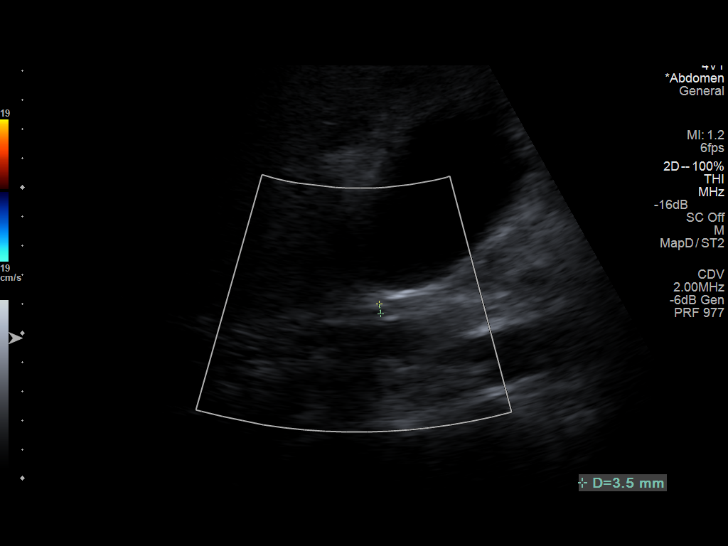

[14 of 25 positions shown; findings below may reference images not displayed]

FINDINGS: Gallbladder:

The gallbladder is adequately distended. There is echogenic layering
mobile material consistent with sludge. No calcified stones are
demonstrated. There is no gallbladder wall thickening,
pericholecystic fluid, or positive sonographic Murphy's sign.

Common bile duct:

Diameter: 3.7 mm

Liver:

The hepatic echotexture is mildly increased. The liver is enlarged
with greatest measured dimension of 24 cm. There is no focal mass or
intrahepatic ductal dilation. The hepatic surface is slightly
nodular.
IMPRESSION: 1. Hepatomegaly and fatty infiltrative change. Surface nodularity is
consistent with known cirrhosis.
2. Gallbladder sludge without evidence of acute cholecystitis.

## 2016-07-29 IMAGING — CT CT ABD-PELV W/ CM
2 of 5 series · 16 of 46 positions shown, 18 images · IV contrast (Omnipaque 300)
Comparison: None.

CLINICAL DATA: Abdominal pain, unspecified

EXAM:
CT ABDOMEN AND PELVIS WITH CONTRAST
TECHNIQUE: Multidetector CT imaging of the abdomen and pelvis was performed
using the standard protocol following bolus administration of
intravenous contrast.
CONTRAST:  50mL OMNIPAQUE IOHEXOL 300 MG/ML SOLN, 100mL OMNIPAQUE
IOHEXOL 300 MG/ML SOLN

[Series 2: abd_pel_with 5.0 b40f · axial · 0.84mm/px · z∈[+528,+948]mm · 13 of 96 slices shown, 15 images]
[im 6/96  soft-tissue]
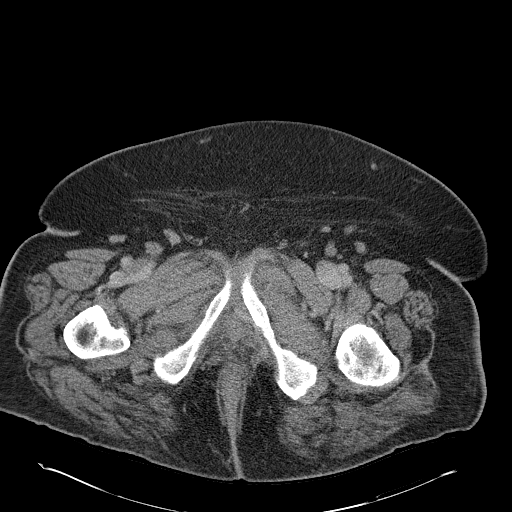
[im 6/96  bone]
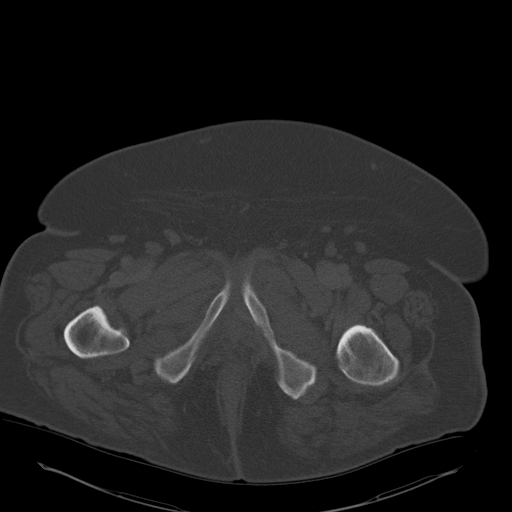
[im 12/96  soft-tissue]
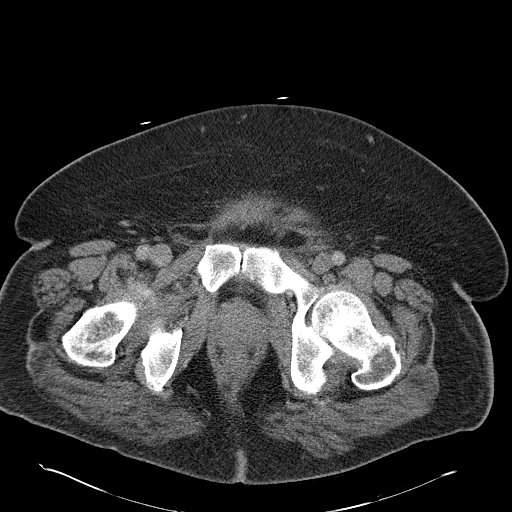
[im 23/96  soft-tissue]
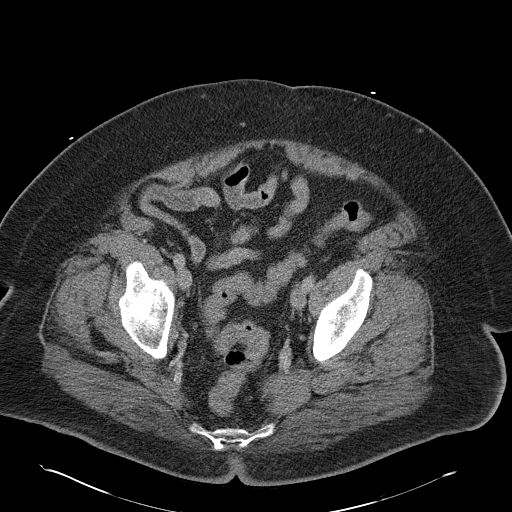
[im 28/96  soft-tissue]
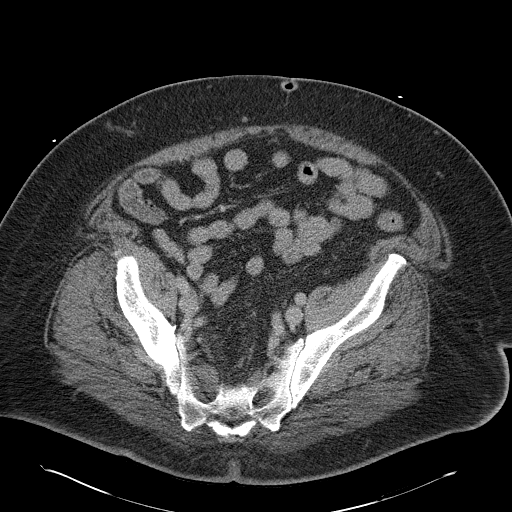
[im 34/96  soft-tissue]
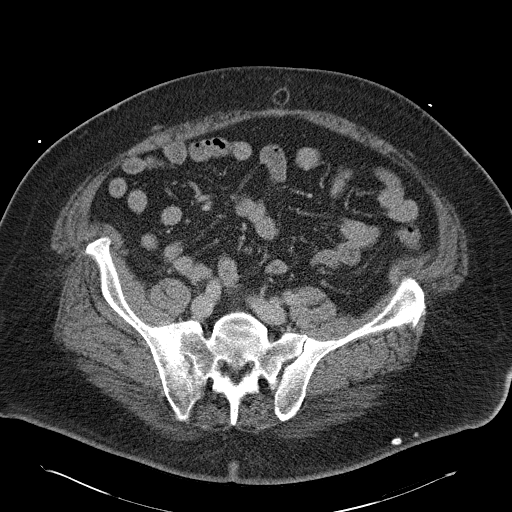
[im 40/96  soft-tissue]
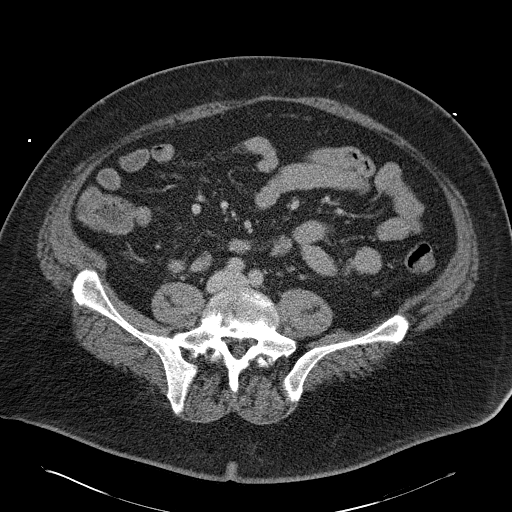
[im 51/96  soft-tissue]
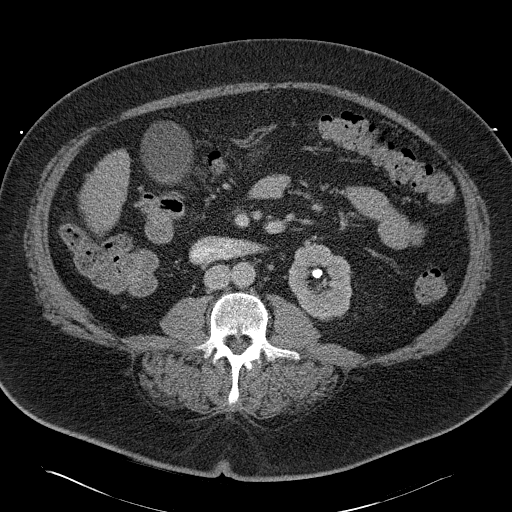
[im 56/96  soft-tissue]
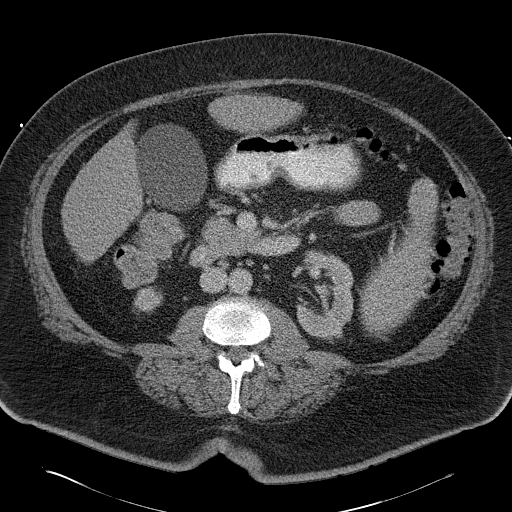
[im 62/96  soft-tissue]
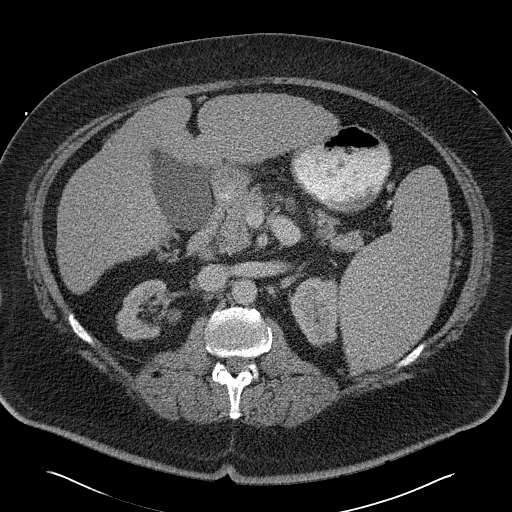
[im 62/96  bone]
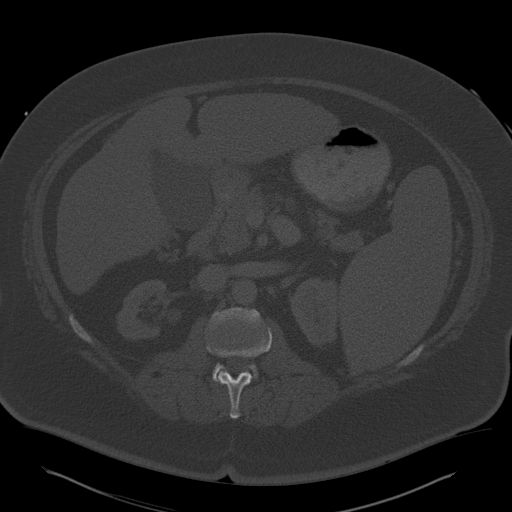
[im 68/96  soft-tissue]
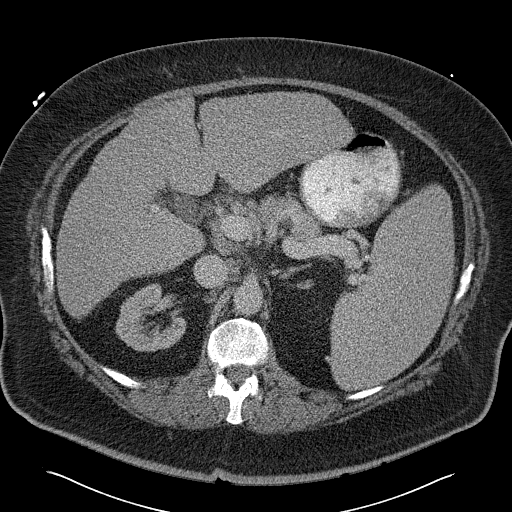
[im 73/96  soft-tissue]
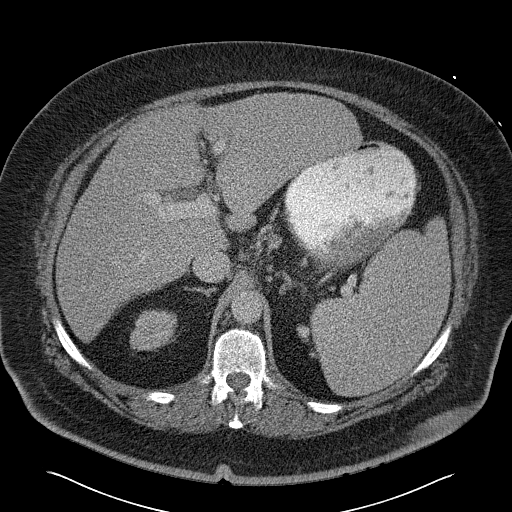
[im 84/96  soft-tissue]
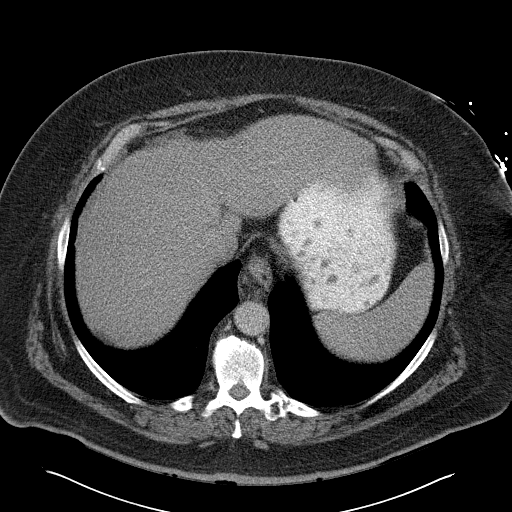
[im 90/96  soft-tissue]
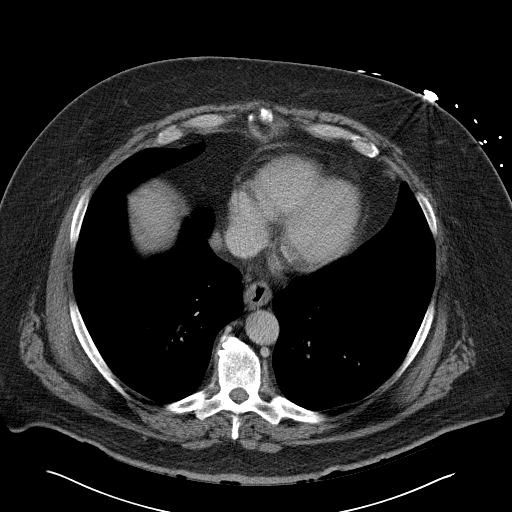

[Series 3: abd_pel_with 3.0 spo cor · coronal · 0.94mm/px · 3 of 123 slices shown]
[im 41/123  soft-tissue]
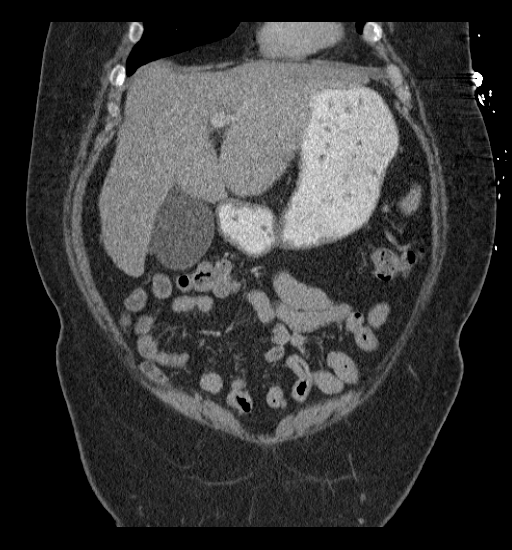
[im 55/123  soft-tissue]
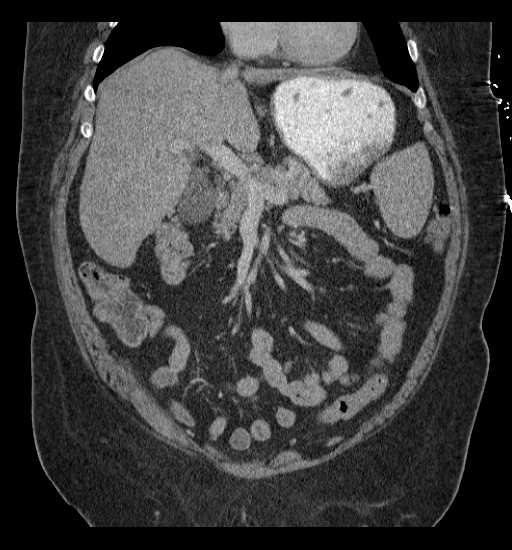
[im 68/123  soft-tissue]
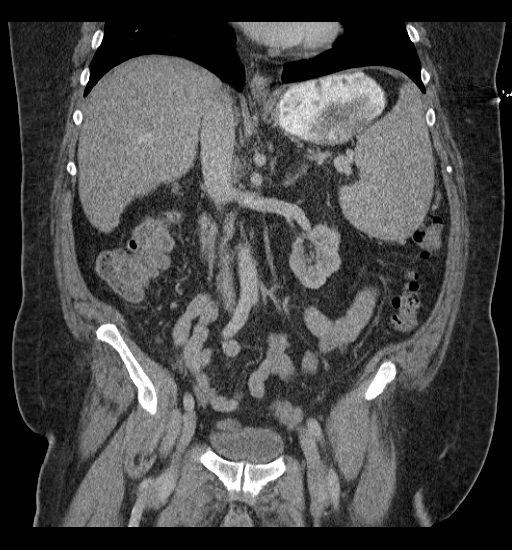

[16 of 46 positions shown; findings below may reference images not displayed]

FINDINGS: BODY WALL: Unremarkable.

LOWER CHEST:  Mild herniation of fat through the esophageal hiatus.

ABDOMEN/PELVIS:

Liver: Diffuse fatty infiltration of the liver. Cirrhotic liver
morphology with surface lobulation, caudate enlargement, and fissure
enlargement. No focal mass lesion is identified. Prominent lymph
nodes in the deep liver drainage which are usually reactive.

Biliary: Gallbladder distention without calcified stone or
surrounding inflammation

Pancreas: Unremarkable.

Spleen: 8 cm thick and 17 cm in AP dimension, consistent with
splenomegaly. There is no focal lesion. The splenic enlargement is
likely from portal hypertension.

Adrenals: Unremarkable.

Kidneys and ureters: 6 mm stone in the distal right ureter without
hydroureter or hydronephrosis. No left ureteral calculus or
hydronephrosis. Bilateral renal calculi, left more extensive than
right. The largest stone on the left is in the lower pole measuring
8 mm. The largest on the right is in the upper pole measuring 2-3
mm.

Bladder: Unremarkable.

Reproductive: Unremarkable.

Bowel: No obstruction. Negative appendix.

Retroperitoneum: Adenopathy in the deep liver drainage, usually
reactive in the setting of chronic liver disease.

Peritoneum: No ascites or pneumoperitoneum.

Vascular: No acute abnormality.

OSSEOUS: No acute abnormalities.
IMPRESSION: 1. Nonobstructing 6 mm stone in the distal right ureter.
2. Extensive bilateral nephrolithiasis.
3. Cirrhosis with portal hypertension.

## 2016-08-06 DIAGNOSIS — E1165 Type 2 diabetes mellitus with hyperglycemia: Secondary | ICD-10-CM | POA: Diagnosis not present

## 2016-08-11 DIAGNOSIS — E1165 Type 2 diabetes mellitus with hyperglycemia: Secondary | ICD-10-CM | POA: Diagnosis not present

## 2016-08-11 DIAGNOSIS — L409 Psoriasis, unspecified: Secondary | ICD-10-CM | POA: Diagnosis not present

## 2016-08-11 DIAGNOSIS — Z0000 Encounter for general adult medical examination without abnormal findings: Secondary | ICD-10-CM | POA: Diagnosis not present

## 2016-08-11 DIAGNOSIS — Z6839 Body mass index (BMI) 39.0-39.9, adult: Secondary | ICD-10-CM | POA: Diagnosis not present

## 2016-08-11 DIAGNOSIS — M25571 Pain in right ankle and joints of right foot: Secondary | ICD-10-CM | POA: Diagnosis not present

## 2016-08-11 DIAGNOSIS — Z23 Encounter for immunization: Secondary | ICD-10-CM | POA: Diagnosis not present

## 2016-08-11 DIAGNOSIS — G9009 Other idiopathic peripheral autonomic neuropathy: Secondary | ICD-10-CM | POA: Diagnosis not present

## 2016-11-10 DIAGNOSIS — M79642 Pain in left hand: Secondary | ICD-10-CM | POA: Diagnosis not present

## 2016-11-10 DIAGNOSIS — Z6839 Body mass index (BMI) 39.0-39.9, adult: Secondary | ICD-10-CM | POA: Diagnosis not present

## 2016-11-10 DIAGNOSIS — L03114 Cellulitis of left upper limb: Secondary | ICD-10-CM | POA: Diagnosis not present

## 2017-02-03 DIAGNOSIS — G9009 Other idiopathic peripheral autonomic neuropathy: Secondary | ICD-10-CM | POA: Diagnosis not present

## 2017-02-03 DIAGNOSIS — L409 Psoriasis, unspecified: Secondary | ICD-10-CM | POA: Diagnosis not present

## 2017-02-03 DIAGNOSIS — M109 Gout, unspecified: Secondary | ICD-10-CM | POA: Diagnosis not present

## 2017-02-03 DIAGNOSIS — E1165 Type 2 diabetes mellitus with hyperglycemia: Secondary | ICD-10-CM | POA: Diagnosis not present

## 2017-02-09 DIAGNOSIS — E1165 Type 2 diabetes mellitus with hyperglycemia: Secondary | ICD-10-CM | POA: Diagnosis not present

## 2017-02-09 DIAGNOSIS — G9009 Other idiopathic peripheral autonomic neuropathy: Secondary | ICD-10-CM | POA: Diagnosis not present

## 2017-02-09 DIAGNOSIS — L409 Psoriasis, unspecified: Secondary | ICD-10-CM | POA: Diagnosis not present

## 2017-02-09 DIAGNOSIS — Z6841 Body Mass Index (BMI) 40.0 and over, adult: Secondary | ICD-10-CM | POA: Diagnosis not present

## 2017-04-14 ENCOUNTER — Ambulatory Visit (HOSPITAL_COMMUNITY)
Admission: RE | Admit: 2017-04-14 | Discharge: 2017-04-14 | Disposition: A | Discharge status: Home / Self Care | Payer: Medicare HMO | Source: Ambulatory Visit | Attending: Family Medicine | Admitting: Family Medicine

## 2017-04-14 ENCOUNTER — Other Ambulatory Visit (HOSPITAL_COMMUNITY): Payer: Self-pay | Admitting: Family Medicine

## 2017-04-14 DIAGNOSIS — M778 Other enthesopathies, not elsewhere classified: Secondary | ICD-10-CM | POA: Insufficient documentation

## 2017-04-14 DIAGNOSIS — M1712 Unilateral primary osteoarthritis, left knee: Secondary | ICD-10-CM | POA: Diagnosis not present

## 2017-04-14 DIAGNOSIS — M25562 Pain in left knee: Secondary | ICD-10-CM | POA: Insufficient documentation

## 2017-04-14 DIAGNOSIS — M25462 Effusion, left knee: Secondary | ICD-10-CM | POA: Diagnosis not present

## 2017-04-14 DIAGNOSIS — I70202 Unspecified atherosclerosis of native arteries of extremities, left leg: Secondary | ICD-10-CM | POA: Diagnosis not present

## 2017-04-14 DIAGNOSIS — R609 Edema, unspecified: Secondary | ICD-10-CM | POA: Diagnosis not present

## 2017-04-14 DIAGNOSIS — M109 Gout, unspecified: Secondary | ICD-10-CM | POA: Diagnosis not present

## 2017-05-08 DIAGNOSIS — Z6841 Body Mass Index (BMI) 40.0 and over, adult: Secondary | ICD-10-CM | POA: Diagnosis not present

## 2017-05-08 DIAGNOSIS — M109 Gout, unspecified: Secondary | ICD-10-CM | POA: Diagnosis not present

## 2017-05-21 DIAGNOSIS — Z6841 Body Mass Index (BMI) 40.0 and over, adult: Secondary | ICD-10-CM | POA: Diagnosis not present

## 2017-05-21 DIAGNOSIS — M1712 Unilateral primary osteoarthritis, left knee: Secondary | ICD-10-CM | POA: Diagnosis not present

## 2017-05-21 DIAGNOSIS — M109 Gout, unspecified: Secondary | ICD-10-CM | POA: Diagnosis not present

## 2017-05-21 DIAGNOSIS — L409 Psoriasis, unspecified: Secondary | ICD-10-CM | POA: Diagnosis not present

## 2017-05-21 DIAGNOSIS — M25562 Pain in left knee: Secondary | ICD-10-CM | POA: Diagnosis not present

## 2017-06-02 DIAGNOSIS — M25562 Pain in left knee: Secondary | ICD-10-CM | POA: Diagnosis not present

## 2017-06-02 DIAGNOSIS — S83282A Other tear of lateral meniscus, current injury, left knee, initial encounter: Secondary | ICD-10-CM | POA: Diagnosis not present

## 2017-06-02 DIAGNOSIS — S83242A Other tear of medial meniscus, current injury, left knee, initial encounter: Secondary | ICD-10-CM | POA: Diagnosis not present

## 2017-06-03 DIAGNOSIS — M1712 Unilateral primary osteoarthritis, left knee: Secondary | ICD-10-CM | POA: Diagnosis not present

## 2017-06-03 DIAGNOSIS — S83242A Other tear of medial meniscus, current injury, left knee, initial encounter: Secondary | ICD-10-CM | POA: Diagnosis not present

## 2017-06-03 DIAGNOSIS — L409 Psoriasis, unspecified: Secondary | ICD-10-CM | POA: Diagnosis not present

## 2017-06-03 DIAGNOSIS — R600 Localized edema: Secondary | ICD-10-CM | POA: Diagnosis not present

## 2017-06-03 DIAGNOSIS — L4052 Psoriatic arthritis mutilans: Secondary | ICD-10-CM | POA: Diagnosis not present

## 2017-06-03 DIAGNOSIS — M109 Gout, unspecified: Secondary | ICD-10-CM | POA: Diagnosis not present

## 2017-06-03 DIAGNOSIS — Z6841 Body Mass Index (BMI) 40.0 and over, adult: Secondary | ICD-10-CM | POA: Diagnosis not present

## 2017-06-03 DIAGNOSIS — S83282A Other tear of lateral meniscus, current injury, left knee, initial encounter: Secondary | ICD-10-CM | POA: Diagnosis not present

## 2017-06-08 DIAGNOSIS — R600 Localized edema: Secondary | ICD-10-CM | POA: Diagnosis not present

## 2017-06-16 DIAGNOSIS — M1712 Unilateral primary osteoarthritis, left knee: Secondary | ICD-10-CM | POA: Diagnosis not present

## 2017-06-16 DIAGNOSIS — L409 Psoriasis, unspecified: Secondary | ICD-10-CM | POA: Diagnosis not present

## 2017-06-16 DIAGNOSIS — M25562 Pain in left knee: Secondary | ICD-10-CM | POA: Diagnosis not present

## 2017-06-16 DIAGNOSIS — M25462 Effusion, left knee: Secondary | ICD-10-CM | POA: Diagnosis not present

## 2017-06-23 DIAGNOSIS — L409 Psoriasis, unspecified: Secondary | ICD-10-CM | POA: Diagnosis not present

## 2017-06-23 DIAGNOSIS — M1A09X0 Idiopathic chronic gout, multiple sites, without tophus (tophi): Secondary | ICD-10-CM | POA: Diagnosis not present

## 2017-06-23 DIAGNOSIS — M2550 Pain in unspecified joint: Secondary | ICD-10-CM | POA: Diagnosis not present

## 2017-06-23 DIAGNOSIS — Z6841 Body Mass Index (BMI) 40.0 and over, adult: Secondary | ICD-10-CM | POA: Diagnosis not present

## 2017-06-23 DIAGNOSIS — M150 Primary generalized (osteo)arthritis: Secondary | ICD-10-CM | POA: Diagnosis not present

## 2017-06-23 DIAGNOSIS — R5382 Chronic fatigue, unspecified: Secondary | ICD-10-CM | POA: Diagnosis not present

## 2017-07-07 DIAGNOSIS — M1A09X0 Idiopathic chronic gout, multiple sites, without tophus (tophi): Secondary | ICD-10-CM | POA: Diagnosis not present

## 2017-07-07 DIAGNOSIS — M2550 Pain in unspecified joint: Secondary | ICD-10-CM | POA: Diagnosis not present

## 2017-07-07 DIAGNOSIS — M150 Primary generalized (osteo)arthritis: Secondary | ICD-10-CM | POA: Diagnosis not present

## 2017-07-07 DIAGNOSIS — R5382 Chronic fatigue, unspecified: Secondary | ICD-10-CM | POA: Diagnosis not present

## 2017-07-07 DIAGNOSIS — L409 Psoriasis, unspecified: Secondary | ICD-10-CM | POA: Diagnosis not present

## 2017-07-07 DIAGNOSIS — L4059 Other psoriatic arthropathy: Secondary | ICD-10-CM | POA: Diagnosis not present

## 2017-07-07 DIAGNOSIS — Z6841 Body Mass Index (BMI) 40.0 and over, adult: Secondary | ICD-10-CM | POA: Diagnosis not present

## 2017-08-04 DIAGNOSIS — E1165 Type 2 diabetes mellitus with hyperglycemia: Secondary | ICD-10-CM | POA: Diagnosis not present

## 2017-08-04 DIAGNOSIS — G9009 Other idiopathic peripheral autonomic neuropathy: Secondary | ICD-10-CM | POA: Diagnosis not present

## 2017-08-04 DIAGNOSIS — R600 Localized edema: Secondary | ICD-10-CM | POA: Diagnosis not present

## 2017-08-06 DIAGNOSIS — S83282A Other tear of lateral meniscus, current injury, left knee, initial encounter: Secondary | ICD-10-CM | POA: Diagnosis not present

## 2017-08-06 DIAGNOSIS — M1712 Unilateral primary osteoarthritis, left knee: Secondary | ICD-10-CM | POA: Diagnosis not present

## 2017-08-06 DIAGNOSIS — M109 Gout, unspecified: Secondary | ICD-10-CM | POA: Diagnosis not present

## 2017-08-06 DIAGNOSIS — Z6841 Body Mass Index (BMI) 40.0 and over, adult: Secondary | ICD-10-CM | POA: Diagnosis not present

## 2017-08-06 DIAGNOSIS — R600 Localized edema: Secondary | ICD-10-CM | POA: Diagnosis not present

## 2017-08-06 DIAGNOSIS — S83242A Other tear of medial meniscus, current injury, left knee, initial encounter: Secondary | ICD-10-CM | POA: Diagnosis not present

## 2017-08-06 DIAGNOSIS — L4052 Psoriatic arthritis mutilans: Secondary | ICD-10-CM | POA: Diagnosis not present

## 2017-08-06 DIAGNOSIS — L409 Psoriasis, unspecified: Secondary | ICD-10-CM | POA: Diagnosis not present

## 2017-08-12 DIAGNOSIS — D649 Anemia, unspecified: Secondary | ICD-10-CM | POA: Diagnosis not present

## 2017-08-12 DIAGNOSIS — D539 Nutritional anemia, unspecified: Secondary | ICD-10-CM | POA: Diagnosis not present

## 2017-09-13 DIAGNOSIS — D649 Anemia, unspecified: Secondary | ICD-10-CM | POA: Diagnosis not present

## 2017-09-20 DIAGNOSIS — Z6841 Body Mass Index (BMI) 40.0 and over, adult: Secondary | ICD-10-CM | POA: Diagnosis not present

## 2017-09-20 DIAGNOSIS — J0101 Acute recurrent maxillary sinusitis: Secondary | ICD-10-CM | POA: Diagnosis not present

## 2017-09-21 DIAGNOSIS — M150 Primary generalized (osteo)arthritis: Secondary | ICD-10-CM | POA: Diagnosis not present

## 2017-09-21 DIAGNOSIS — M1A09X0 Idiopathic chronic gout, multiple sites, without tophus (tophi): Secondary | ICD-10-CM | POA: Diagnosis not present

## 2017-09-21 DIAGNOSIS — R5382 Chronic fatigue, unspecified: Secondary | ICD-10-CM | POA: Diagnosis not present

## 2017-09-21 DIAGNOSIS — L409 Psoriasis, unspecified: Secondary | ICD-10-CM | POA: Diagnosis not present

## 2017-09-21 DIAGNOSIS — Z6841 Body Mass Index (BMI) 40.0 and over, adult: Secondary | ICD-10-CM | POA: Diagnosis not present

## 2017-09-21 DIAGNOSIS — L4059 Other psoriatic arthropathy: Secondary | ICD-10-CM | POA: Diagnosis not present

## 2017-09-21 DIAGNOSIS — M2550 Pain in unspecified joint: Secondary | ICD-10-CM | POA: Diagnosis not present

## 2017-10-06 ENCOUNTER — Encounter (HOSPITAL_COMMUNITY): Payer: Medicare HMO | Attending: Oncology | Admitting: Oncology

## 2017-10-06 ENCOUNTER — Encounter (HOSPITAL_COMMUNITY): Payer: Self-pay

## 2017-10-06 ENCOUNTER — Encounter (HOSPITAL_COMMUNITY): Payer: Medicare HMO

## 2017-10-06 DIAGNOSIS — Z79899 Other long term (current) drug therapy: Secondary | ICD-10-CM | POA: Insufficient documentation

## 2017-10-06 DIAGNOSIS — R161 Splenomegaly, not elsewhere classified: Secondary | ICD-10-CM | POA: Insufficient documentation

## 2017-10-06 DIAGNOSIS — Z79891 Long term (current) use of opiate analgesic: Secondary | ICD-10-CM | POA: Insufficient documentation

## 2017-10-06 DIAGNOSIS — D649 Anemia, unspecified: Secondary | ICD-10-CM | POA: Diagnosis not present

## 2017-10-06 DIAGNOSIS — Z7984 Long term (current) use of oral hypoglycemic drugs: Secondary | ICD-10-CM | POA: Insufficient documentation

## 2017-10-06 DIAGNOSIS — L4050 Arthropathic psoriasis, unspecified: Secondary | ICD-10-CM

## 2017-10-06 DIAGNOSIS — K2270 Barrett's esophagus without dysplasia: Secondary | ICD-10-CM | POA: Insufficient documentation

## 2017-10-06 DIAGNOSIS — Z880 Allergy status to penicillin: Secondary | ICD-10-CM | POA: Diagnosis not present

## 2017-10-06 DIAGNOSIS — K7460 Unspecified cirrhosis of liver: Secondary | ICD-10-CM | POA: Diagnosis not present

## 2017-10-06 DIAGNOSIS — L409 Psoriasis, unspecified: Secondary | ICD-10-CM | POA: Diagnosis not present

## 2017-10-06 DIAGNOSIS — K760 Fatty (change of) liver, not elsewhere classified: Secondary | ICD-10-CM | POA: Insufficient documentation

## 2017-10-06 DIAGNOSIS — E119 Type 2 diabetes mellitus without complications: Secondary | ICD-10-CM | POA: Diagnosis not present

## 2017-10-06 DIAGNOSIS — D696 Thrombocytopenia, unspecified: Secondary | ICD-10-CM | POA: Insufficient documentation

## 2017-10-06 DIAGNOSIS — Z9889 Other specified postprocedural states: Secondary | ICD-10-CM | POA: Diagnosis not present

## 2017-10-06 DIAGNOSIS — Z87891 Personal history of nicotine dependence: Secondary | ICD-10-CM | POA: Diagnosis not present

## 2017-10-06 DIAGNOSIS — Z87442 Personal history of urinary calculi: Secondary | ICD-10-CM | POA: Insufficient documentation

## 2017-10-06 DIAGNOSIS — Z800 Family history of malignant neoplasm of digestive organs: Secondary | ICD-10-CM | POA: Diagnosis not present

## 2017-10-06 LAB — CBC WITH DIFFERENTIAL/PLATELET
Basophils Absolute: 0.1 10*3/uL (ref 0.0–0.1)
Basophils Relative: 1 %
EOS ABS: 0.2 10*3/uL (ref 0.0–0.7)
EOS PCT: 3 %
HCT: 36.7 % — ABNORMAL LOW (ref 39.0–52.0)
Hemoglobin: 12.3 g/dL — ABNORMAL LOW (ref 13.0–17.0)
LYMPHS ABS: 2 10*3/uL (ref 0.7–4.0)
LYMPHS PCT: 32 %
MCH: 31.8 pg (ref 26.0–34.0)
MCHC: 33.5 g/dL (ref 30.0–36.0)
MCV: 94.8 fL (ref 78.0–100.0)
MONO ABS: 0.5 10*3/uL (ref 0.1–1.0)
Monocytes Relative: 8 %
Neutro Abs: 3.6 10*3/uL (ref 1.7–7.7)
Neutrophils Relative %: 57 %
PLATELETS: 116 10*3/uL — AB (ref 150–400)
RBC: 3.87 MIL/uL — ABNORMAL LOW (ref 4.22–5.81)
RDW: 13.7 % (ref 11.5–15.5)
WBC: 6.3 10*3/uL (ref 4.0–10.5)

## 2017-10-06 LAB — RETICULOCYTES
RBC.: 3.87 MIL/uL — ABNORMAL LOW (ref 4.22–5.81)
RETIC CT PCT: 1.6 % (ref 0.4–3.1)
Retic Count, Absolute: 61.9 10*3/uL (ref 19.0–186.0)

## 2017-10-06 LAB — COMPREHENSIVE METABOLIC PANEL
ALT: 26 U/L (ref 17–63)
ANION GAP: 9 (ref 5–15)
AST: 36 U/L (ref 15–41)
Albumin: 3.9 g/dL (ref 3.5–5.0)
Alkaline Phosphatase: 98 U/L (ref 38–126)
BUN: 20 mg/dL (ref 6–20)
CHLORIDE: 106 mmol/L (ref 101–111)
CO2: 24 mmol/L (ref 22–32)
Calcium: 8.9 mg/dL (ref 8.9–10.3)
Creatinine, Ser: 1.61 mg/dL — ABNORMAL HIGH (ref 0.61–1.24)
GFR calc Af Amer: 50 mL/min — ABNORMAL LOW (ref >60)
GFR, EST NON AFRICAN AMERICAN: 43 mL/min — AB (ref >60)
Glucose, Bld: 76 mg/dL (ref 65–99)
POTASSIUM: 4 mmol/L (ref 3.5–5.1)
SODIUM: 139 mmol/L (ref 135–145)
Total Bilirubin: 1.1 mg/dL (ref 0.3–1.2)
Total Protein: 7.2 g/dL (ref 6.5–8.1)

## 2017-10-06 LAB — FERRITIN: FERRITIN: 51 ng/mL (ref 24–336)

## 2017-10-06 LAB — IRON AND TIBC
IRON: 82 ug/dL (ref 45–182)
Saturation Ratios: 21 % (ref 17.9–39.5)
TIBC: 386 ug/dL (ref 250–450)
UIBC: 304 ug/dL

## 2017-10-06 LAB — LACTATE DEHYDROGENASE: LDH: 135 U/L (ref 98–192)

## 2017-10-06 LAB — FOLATE: Folate: 16.9 ng/mL (ref >5.9)

## 2017-10-06 LAB — VITAMIN B12: Vitamin B-12: 297 pg/mL (ref 180–914)
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
## 2017-10-06 NOTE — Patient Instructions (Signed)
[place] at [facility] Discharge Instructions  RECOMMENDATIONS MADE BY THE CONSULTANT AND ANY TEST RESULTS WILL BE SENT TO YOUR REFERRING PHYSICIAN.  You were seen today by Dr. Twana First We will schedule you for labs today and get you set up for an abdominal ultrasound Follow up in 2 weeks to review results.   Thank you for choosing [place] at [facility] to provide your oncology and hematology care.  To afford each patient quality time with our provider, please arrive at least 15 minutes before your scheduled appointment time.    If you have a lab appointment with the [facility] please come in thru the  Main Entrance and check in at the main information desk  You need to re-schedule your appointment should you arrive 10 or more minutes late.  We strive to give you quality time with our providers, and arriving late affects you and other patients whose appointments are after yours.  Also, if you no show three or more times for appointments you may be dismissed from the clinic at the providers discretion.     Again, thank you for choosing [facility]  Our hope is that these requests will decrease the amount of time that you wait before being seen by our physicians.       _____________________________________________________________  Should you have questions after your visit to [facility], please contact our office at [phone] between the hours of 8:30 a.m. and 4:30 p.m.  Voicemails left after 4:30 p.m. will not be returned until the following business day.  For prescription refill requests, have your pharmacy contact our office.       Resources For Cancer Patients and their Caregivers ? American Cancer Society: Can assist with transportation, wigs, general needs, runs Look Good Feel Better.        (256)800-3043 ? Cancer Care: Provides financial assistance, online support groups, medication/co-pay  assistance.  1-800-813-HOPE 650-119-5624) ? [place] Assists [place] cancer patients and their families through emotional , educational and financial support.  650-465-2367 ? [place] DSS Where to apply for food stamps, Medicaid and utility assistance. 7875384346 ? [employer]: Transportation to medical appointments. 602-554-8449 ? Social Security Administration: May apply for disability if have a Stage IV cancer. 304-496-6091 (504) 066-9936 ? [employer], Disability and Transit Services: Assists with nutrition, care and transit needs. [place] Support Programs: @10RELATIVEDAYS @ > Cancer Support Group  2nd Tuesday of the month 1pm-2pm, Journey Room  > Creative Journey  3rd Tuesday of the month 1130am-1pm, Journey Room  > Look Good Feel Better  1st Wednesday of the month 10am-12 noon, Journey Room (Call [facility] to register [phone])

## 2017-10-07 LAB — PROTEIN ELECTROPHORESIS, SERUM
A/G Ratio: 1.1 (ref 0.7–1.7)
ALPHA-1-GLOBULIN: 0.3 g/dL (ref 0.0–0.4)
Albumin ELP: 3.5 g/dL (ref 2.9–4.4)
Alpha-2-Globulin: 0.8 g/dL (ref 0.4–1.0)
Beta Globulin: 1.3 g/dL (ref 0.7–1.3)
GAMMA GLOBULIN: 1 g/dL (ref 0.4–1.8)
Globulin, Total: 3.3 g/dL (ref 2.2–3.9)
TOTAL PROTEIN ELP: 6.8 g/dL (ref 6.0–8.5)

## 2017-10-07 LAB — ERYTHROPOIETIN: Erythropoietin: 21.1 m[IU]/mL — ABNORMAL HIGH (ref 2.6–18.5)

## 2017-10-07 LAB — HEMOGLOBINOPATHY EVALUATION
HGB A2 QUANT: 2.6 % (ref 1.8–3.2)
HGB A: 97.4 % (ref 96.4–98.8)
HGB C: 0 %
HGB VARIANT: 0 %
Hgb F Quant: 0 % (ref 0.0–2.0)
Hgb S Quant: 0 %

## 2017-10-08 ENCOUNTER — Ambulatory Visit (HOSPITAL_COMMUNITY)
Admission: RE | Admit: 2017-10-08 | Discharge: 2017-10-08 | Disposition: A | Discharge status: Home / Self Care | Payer: Medicare HMO | Source: Ambulatory Visit | Attending: Oncology | Admitting: Oncology

## 2017-10-08 DIAGNOSIS — R161 Splenomegaly, not elsewhere classified: Secondary | ICD-10-CM | POA: Diagnosis not present

## 2017-10-08 DIAGNOSIS — R188 Other ascites: Secondary | ICD-10-CM | POA: Insufficient documentation

## 2017-10-08 DIAGNOSIS — K7460 Unspecified cirrhosis of liver: Secondary | ICD-10-CM | POA: Insufficient documentation

## 2017-10-08 LAB — IMMUNOFIXATION ELECTROPHORESIS
IGA: 241 mg/dL (ref 61–437)
IGM (IMMUNOGLOBULIN M), SRM: 99 mg/dL (ref 20–172)
IgG (Immunoglobin G), Serum: 993 mg/dL (ref 700–1600)
TOTAL PROTEIN ELP: 6.8 g/dL (ref 6.0–8.5)

## 2017-10-15 ENCOUNTER — Ambulatory Visit (HOSPITAL_COMMUNITY): Payer: Medicare HMO

## 2017-10-20 ENCOUNTER — Encounter (HOSPITAL_COMMUNITY): Payer: Self-pay | Admitting: Oncology

## 2017-10-20 ENCOUNTER — Encounter (HOSPITAL_BASED_OUTPATIENT_CLINIC_OR_DEPARTMENT_OTHER): Payer: Medicare HMO | Admitting: Oncology

## 2017-10-20 VITALS — BP 175/86 | HR 78 | Temp 98.1°F | Resp 18 | Wt 317.4 lb

## 2017-10-20 DIAGNOSIS — K7460 Unspecified cirrhosis of liver: Secondary | ICD-10-CM

## 2017-10-20 DIAGNOSIS — D649 Anemia, unspecified: Secondary | ICD-10-CM | POA: Diagnosis not present

## 2017-10-20 DIAGNOSIS — D508 Other iron deficiency anemias: Secondary | ICD-10-CM

## 2017-10-20 DIAGNOSIS — D696 Thrombocytopenia, unspecified: Secondary | ICD-10-CM | POA: Diagnosis not present

## 2017-10-20 DIAGNOSIS — N189 Chronic kidney disease, unspecified: Secondary | ICD-10-CM

## 2017-10-20 DIAGNOSIS — R161 Splenomegaly, not elsewhere classified: Secondary | ICD-10-CM | POA: Diagnosis not present

## 2017-10-20 DIAGNOSIS — L409 Psoriasis, unspecified: Secondary | ICD-10-CM

## 2017-10-20 MED ORDER — VITAMIN B-12 1000 MCG PO TABS: 1000.0000 ug | ORAL_TABLET | Freq: Every day | ORAL | 2 refills | Status: DC

## 2017-10-20 MED ORDER — FERROUS SULFATE 325 (65 FE) MG PO TBEC: 325.0000 mg | DELAYED_RELEASE_TABLET | Freq: Every day | ORAL | 2 refills | Status: DC

## 2017-10-20 NOTE — Patient Instructions (Signed)
[facility] at [facility] Discharge Instructions  RECOMMENDATIONS MADE BY THE CONSULTANT AND ANY TEST RESULTS WILL BE SENT TO YOUR REFERRING PHYSICIAN.  You were seen today by Dr. Louise Zhou    Thank you for choosing [facility] at [facility] to provide your oncology and hematology care.  To afford each patient quality time with our provider, please arrive at least 15 minutes before your scheduled appointment time.    If you have a lab appointment with the Cancer Center please come in thru the  Main Entrance and check in at the main information desk  You need to re-schedule your appointment should you arrive 10 or more minutes late.  We strive to give you quality time with our providers, and arriving late affects you and other patients whose appointments are after yours.  Also, if you no show three or more times for appointments you may be dismissed from the clinic at the providers discretion.     Again, thank you for choosing [facility].  Our hope is that these requests will decrease the amount of time that you wait before being seen by our physicians.       _____________________________________________________________  Should you have questions after your visit to [facility], please contact our office at [phone] between the hours of 8:30 a.m. and 4:30 p.m.  Voicemails left after 4:30 p.m. will not be returned until the following business day.  For prescription refill requests, have your pharmacy contact our office.       Resources For Cancer Patients and their Caregivers ? American Cancer Society: Can assist with transportation, wigs, general needs, runs Look Good Feel Better.        1-888-227-6333 ? Cancer Care: Provides financial assistance, online support groups, medication/co-pay assistance.  1-800-813-HOPE (4673) ? [facility] Assists [place] cancer patients and their  families through emotional , educational and financial support.  336-427-4357 ? [place] DSS Where to apply for food stamps, Medicaid and utility assistance. 336-342-1394 ? [employer]: Transportation to medical appointments. 336-347-2287 ? Social Security Administration: May apply for disability if have a Stage IV cancer. 336-342-7796 1-800-772-1213 ? [place] [facility], Disability and Transit Services: Assists with nutrition, care and transit needs. 336-349-2343  Cancer Center Support Programs: @10RELATIVEDAYS@ > Cancer Support Group  2nd Tuesday of the month 1pm-2pm, Journey Room  > Creative Journey  3rd Tuesday of the month 1130am-1pm, Journey Room  > Look Good Feel Better  1st Wednesday of the month 10am-12 noon, Journey Room (Call [employer] to register 1-800-395-5775)    
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
## 2017-10-22 DIAGNOSIS — J0101 Acute recurrent maxillary sinusitis: Secondary | ICD-10-CM | POA: Diagnosis not present

## 2017-10-22 DIAGNOSIS — Z6841 Body Mass Index (BMI) 40.0 and over, adult: Secondary | ICD-10-CM | POA: Diagnosis not present

## 2017-11-03 DIAGNOSIS — L409 Psoriasis, unspecified: Secondary | ICD-10-CM | POA: Diagnosis not present

## 2017-11-03 DIAGNOSIS — E1165 Type 2 diabetes mellitus with hyperglycemia: Secondary | ICD-10-CM | POA: Diagnosis not present

## 2017-11-03 DIAGNOSIS — M1712 Unilateral primary osteoarthritis, left knee: Secondary | ICD-10-CM | POA: Diagnosis not present

## 2017-11-03 DIAGNOSIS — Z0001 Encounter for general adult medical examination with abnormal findings: Secondary | ICD-10-CM | POA: Diagnosis not present

## 2017-11-03 DIAGNOSIS — M109 Gout, unspecified: Secondary | ICD-10-CM | POA: Diagnosis not present

## 2017-11-03 DIAGNOSIS — D649 Anemia, unspecified: Secondary | ICD-10-CM | POA: Diagnosis not present

## 2017-11-03 DIAGNOSIS — G9009 Other idiopathic peripheral autonomic neuropathy: Secondary | ICD-10-CM | POA: Diagnosis not present

## 2017-11-03 DIAGNOSIS — Z6841 Body Mass Index (BMI) 40.0 and over, adult: Secondary | ICD-10-CM | POA: Diagnosis not present

## 2017-11-05 DIAGNOSIS — Z6841 Body Mass Index (BMI) 40.0 and over, adult: Secondary | ICD-10-CM | POA: Diagnosis not present

## 2017-11-05 DIAGNOSIS — M1712 Unilateral primary osteoarthritis, left knee: Secondary | ICD-10-CM | POA: Diagnosis not present

## 2017-11-05 DIAGNOSIS — R600 Localized edema: Secondary | ICD-10-CM | POA: Diagnosis not present

## 2017-11-05 DIAGNOSIS — D649 Anemia, unspecified: Secondary | ICD-10-CM | POA: Diagnosis not present

## 2017-11-05 DIAGNOSIS — E1165 Type 2 diabetes mellitus with hyperglycemia: Secondary | ICD-10-CM | POA: Diagnosis not present

## 2017-11-05 DIAGNOSIS — G9009 Other idiopathic peripheral autonomic neuropathy: Secondary | ICD-10-CM | POA: Diagnosis not present

## 2017-11-05 DIAGNOSIS — M109 Gout, unspecified: Secondary | ICD-10-CM | POA: Diagnosis not present

## 2017-11-05 DIAGNOSIS — L4052 Psoriatic arthritis mutilans: Secondary | ICD-10-CM | POA: Diagnosis not present

## 2017-11-11 ENCOUNTER — Ambulatory Visit: Payer: Medicare HMO | Admitting: Gastroenterology

## 2017-11-11 ENCOUNTER — Encounter: Payer: Self-pay | Admitting: Gastroenterology

## 2017-11-11 VITALS — BP 142/67 | HR 69 | Temp 97.2°F | Ht 70.0 in | Wt 320.0 lb

## 2017-11-11 DIAGNOSIS — K7460 Unspecified cirrhosis of liver: Secondary | ICD-10-CM | POA: Diagnosis not present

## 2017-11-11 NOTE — Patient Instructions (Signed)
1. Please have your labs done. We will contact you with results as available.  2. Return to the office in six months for follow up of your liver.    Cirrhosis Cirrhosis is long-term (chronic) liver injury. The liver is your largest internal organ, and it performs many functions. The liver converts food into energy, removes toxic material from your blood, makes important proteins, and absorbs necessary vitamins from your diet. If you have cirrhosis, it means many of your healthy liver cells have been replaced by scar tissue. This prevents blood from flowing through your liver, which makes it difficult for your liver to function. This scarring is not reversible, but treatment can prevent it from getting worse. What are the causes? Hepatitis C and long-term alcohol abuse are the most common causes of cirrhosis. Other causes include:  Nonalcoholic fatty liver disease.  Hepatitis B infection.  Autoimmune hepatitis.  Diseases that cause blockage of ducts inside the liver.  Inherited liver diseases.  Reactions to certain long-term medicines.  Parasitic infections.  Long-term exposure to certain toxins.  What increases the risk? You may have a higher risk of cirrhosis if you:  Have certain hepatitis viruses.  Abuse alcohol, especially if you are male.  Are overweight.  Share needles.  Have unprotected sex with someone who has hepatitis.  What are the signs or symptoms? You may not have any signs and symptoms at first. Symptoms may not develop until the damage to your liver starts to get worse. Signs and symptoms of cirrhosis may include:  Tenderness in the right-upper part of your abdomen.  Weakness and tiredness (fatigue).  Loss of appetite.  Nausea.  Weight loss and muscle loss.  Itchiness.  Yellow skin and eyes (jaundice).  Buildup of fluid in the abdomen (ascites).  Swelling of the feet and ankles (edema).  Appearance of tiny blood vessels under the  skin.  Mental confusion.  Easy bruising and bleeding.  How is this diagnosed? Your health care provider may suspect cirrhosis based on your symptoms and medical history, especially if you have other medical conditions or a history of alcohol abuse. Your health care provider will do a physical exam to feel your liver and check for signs of cirrhosis. Your health care provider may perform other tests, including:  Blood tests to check: ? Whether you have hepatitis B or C. ? Kidney function. ? Liver function.  Imaging tests such as: ? MRI or CT scan to look for changes seen in advanced cirrhosis. ? Ultrasound to see if normal liver tissue is being replaced by scar tissue.  A procedure using a long needle to take a sample of liver tissue (biopsy) for examination under a microscope. Liver biopsy can confirm the diagnosis of cirrhosis.  How is this treated? Treatment depends on how damaged your liver is and what caused the damage. Treatment may include treating cirrhosis symptoms or treating the underlying causes of the condition to try to slow the progression of the damage. Treatment may include:  Making lifestyle changes, such as: ? Eating a healthy diet. ? Restricting salt intake. ? Maintaining a healthy weight. ? Not abusing drugs or alcohol.  Taking medicines to: ? Treat liver infections or other infections. ? Control itching. ? Reduce fluid buildup. ? Reduce certain blood toxins. ? Reduce risk of bleeding from enlarged blood vessels in the stomach or esophagus (varices).  If varices are causing bleeding problems, you may need treatment with a procedure that ties up the vessels causing them to  fall off (band ligation).  If cirrhosis is causing your liver to fail, your health care provider may recommend a liver transplant.  Other treatments may be recommended depending on any complications of cirrhosis, such as liver-related kidney failure (hepatorenal syndrome).  Follow these  instructions at home:  Take medicines only as directed by your health care provider. Do not use drugs that are toxic to your liver. Ask your health care provider before taking any new medicines, including over-the-counter medicines.  Rest as needed.  Eat a well-balanced diet. Ask your health care provider or dietitian for more information.  You may have to follow a low-salt diet or restrict your water intake as directed.  Do not drink alcohol. This is especially important if you are taking acetaminophen.  Keep all follow-up visits as directed by your health care provider. This is important. Contact a health care provider if:  You have fatigue or weakness that is getting worse.  You develop swelling of the hands, feet, legs, or face.  You have a fever.  You develop loss of appetite.  You have nausea or vomiting.  You develop jaundice.  You develop easy bruising or bleeding. Get help right away if:  You vomit bright red blood or a material that looks like coffee grounds.  You have blood in your stools.  Your stools appear black and tarry.  You become confused.  You have chest pain or trouble breathing. This information is not intended to replace advice given to you by your health care provider. Make sure you discuss any questions you have with your health care provider. Document Released: 12/14/2005 Document Revised: 04/23/2016 Document Reviewed: 08/22/2014 Elsevier Interactive Patient Education  [employer].

## 2017-11-11 NOTE — Progress Notes (Signed)
Primary Care Physician: Lanelle Bal, PA-C/[facility]  Primary Gastroenterologist:  Garfield Cornea, MD   Chief Complaint  Patient presents with  . Cirrhosis    HPI: Jacob Maldonado is a 66 y.o. male here for follow-up cirrhosis.  Patient was seen back in early 2016 for the same.  He had had a CT prior to that visit for other reasons and incidentally noted to have cirrhosis with portal hypertension.  Working diagnosis of Karlene Lineman cirrhosis.  Anti-smooth muscle antibody was weakly positive.  EGD showed no evidence of varices.  Scattered antral erosions.  No portal gastropathy.  Distal esophagus with "tongue" of salmon colored epithelium noted.  Biopsy with no evidence of Barrett's or H. pylori.  Colonoscopy at the same time for high risk screening, multiple tubular adenomas removed.  Next colonoscopy due 2021.  Patient has been seen by Dr. Talbert Cage for anemia.  Suspect anemia of chronic disease in the setting of chronic kidney disease, psoriasis.  She encourage patient to follow-up with [place].  He was started on ferrous sulfate and vitamin B12 with plans for follow-up labs.  Patient is on Enbrel for his psoriasis possibly being changed in the near future due to less than adequate response.  Clinically patient feels well from a GI standpoint.  He tells me his last A1c was 6.  Bowel movements are regular on current bowel regimen.  Denies melena or rectal bleeding.  No abdominal pain.  No upper GI symptoms.  Minimal swelling in the lower extremities.  No confusion, lethargy.   Current Outpatient Medications  Medication Sig Dispense Refill  . allopurinol (ZYLOPRIM) 300 MG tablet Take 300 mg by mouth daily.    Marland Kitchen aspirin EC 81 MG tablet Take 81 mg daily by mouth.    . docusate sodium (EQ STOOL SOFTENER) 250 MG capsule Take 250 mg 2 (two) times daily by mouth.    . etanercept (ENBREL) 25 MG injection Inject 50 mg into the skin once a week.    . ferrous sulfate 325 (65 FE) MG EC tablet Take 1 tablet (325  mg total) by mouth daily with breakfast. 90 tablet 2  . Fiber Complete TABS Take 5 tablets daily by mouth. OTC med     . gabapentin (NEURONTIN) 100 MG capsule Take 100 mg by mouth daily.    Marland Kitchen glipiZIDE (GLUCOTROL) 5 MG tablet Take 5 mg by mouth daily before breakfast.    . metFORMIN (GLUMETZA) 500 MG (MOD) 24 hr tablet Take 1,000 mg 2 (two) times daily with a meal by mouth.     . vitamin B-12 (CYANOCOBALAMIN) 1000 MCG tablet Take 1 tablet (1,000 mcg total) by mouth daily. 90 tablet 2  . docusate sodium (COLACE) 100 MG capsule Take 300 mg by mouth daily.     No current facility-administered medications for this visit.     Allergies as of 11/11/2017 - Review Complete 11/11/2017  Allergen Reaction Noted  . Penicillins Hives 04/10/2011    ROS:  General: Negative for anorexia, weight loss, fever, chills, fatigue, weakness. ENT: Negative for hoarseness, difficulty swallowing , nasal congestion. CV: Negative for chest pain, angina, palpitations, dyspnea on exertion, peripheral edema.  Respiratory: Negative for dyspnea at rest, dyspnea on exertion, cough, sputum, wheezing.  GI: See history of present illness. GU:  Negative for dysuria, hematuria, urinary incontinence, urinary frequency, nocturnal urination.  Endo: Negative for unusual weight change.    Physical Examination:   BP (!) 142/67   Pulse 69   Temp (!)  97.2 F (36.2 C) (Oral)   Ht 5\' 10"  (1.778 m)   Wt (!) 320 lb (145.2 kg)   BMI 45.92 kg/m   General: Well-nourished, well-developed in no acute distress.  Obese. Eyes: No icterus. Mouth: Oropharyngeal mucosa moist and pink , no lesions erythema or exudate. Lungs: Clear to auscultation bilaterally.  Heart: Regular rate and rhythm, no murmurs rubs or gallops.  Abdomen: Bowel sounds are normal, nontender, nondistended, no hepatosplenomegaly or m asses, no abdominal bruits or hernia , no rebound or guarding.  Exam limited due to body habitus Extremities: Trace  lower extremity  edema. No clubbing or deformities. Neuro: Alert and oriented x 4   Skin: Warm and dry, no jaundice.   Psych: Alert and cooperative, normal mood and affect.  Labs:  Lab Results  Component Value Date   CREATININE 1.61 (H) 10/06/2017   BUN 20 10/06/2017   NA 139 10/06/2017   K 4.0 10/06/2017   CL 106 10/06/2017   CO2 24 10/06/2017   Lab Results  Component Value Date   ALT 26 10/06/2017   AST 36 10/06/2017   ALKPHOS 98 10/06/2017   BILITOT 1.1 10/06/2017   Lab Results  Component Value Date   WBC 6.3 10/06/2017   HGB 12.3 (L) 10/06/2017   HCT 36.7 (L) 10/06/2017   MCV 94.8 10/06/2017   PLT 116 (L) 10/06/2017   Lab Results  Component Value Date   IRON 82 10/06/2017   TIBC 386 10/06/2017   FERRITIN 51 10/06/2017   Lab Results  Component Value Date   VITAMINB12 297 10/06/2017   Lab Results  Component Value Date   FOLATE 16.9 10/06/2017    Imaging Studies: No results found.

## 2017-11-11 NOTE — Assessment & Plan Note (Addendum)
66 year old male with history of diabetes mellitus, determined to have cirrhosis based on imaging in 2015, presents today after being lost to follow-up.  Patient encouraged to follow-up with [place] by his hematologist.  Abdominal ultrasound has been updated, no evidence of hepatoma.  He does have evidence of portal hypertension with splenomegaly.  Unable to calculate MELD NA without INR.  To update labs.  Patient tells me he received 2 of 3 of his hepatitis A/B vaccinations but cannot afford the third shot.  At this time he is not interested in pursuing further vaccination.  Discussed at length with patient today, suspected Karlene Lineman cirrhosis, he does have weakly positive smooth muscle antibody effect.  Plan repeat.  Patient "was scared to have a liver biopsy" and decided not to follow-up with [place] 2 years ago.  Reiterated need for him to continue follow-up with [place] for his cirrhosis at least every 6 months.  Discussed signs and symptoms to be looking for as far as decompensation. Discussed need to loose weight as this would be inhibiting factor for liver transplant candidacy IF he developed decompensation.  Consider updating EGD to screen for esophageal varices within the next year.

## 2017-11-12 NOTE — Progress Notes (Signed)
cc'ed to pcp °

## 2017-11-14 LAB — COMPREHENSIVE METABOLIC PANEL
AG RATIO: 1.4 (calc) (ref 1.0–2.5)
ALT: 18 U/L (ref 9–46)
AST: 20 U/L (ref 10–35)
Albumin: 4.1 g/dL (ref 3.6–5.1)
Alkaline phosphatase (APISO): 103 U/L (ref 40–115)
BUN / CREAT RATIO: 19 (calc) (ref 6–22)
BUN: 27 mg/dL — ABNORMAL HIGH (ref 7–25)
CHLORIDE: 108 mmol/L (ref 98–110)
CO2: 25 mmol/L (ref 20–32)
Calcium: 9.1 mg/dL (ref 8.6–10.3)
Creat: 1.43 mg/dL — ABNORMAL HIGH (ref 0.70–1.25)
GLOBULIN: 3 g/dL (ref 1.9–3.7)
GLUCOSE: 102 mg/dL — AB (ref 65–99)
Potassium: 4.8 mmol/L (ref 3.5–5.3)
SODIUM: 141 mmol/L (ref 135–146)
TOTAL PROTEIN: 7.1 g/dL (ref 6.1–8.1)
Total Bilirubin: 0.8 mg/dL (ref 0.2–1.2)

## 2017-11-14 LAB — PROTIME-INR
INR: 1.1
PROTHROMBIN TIME: 11.8 s — AB (ref 9.0–11.5)

## 2017-11-14 LAB — ANTI-SMOOTH MUSCLE ANTIBODY, IGG

## 2017-11-22 DIAGNOSIS — Z6841 Body Mass Index (BMI) 40.0 and over, adult: Secondary | ICD-10-CM | POA: Diagnosis not present

## 2017-11-22 DIAGNOSIS — R5382 Chronic fatigue, unspecified: Secondary | ICD-10-CM | POA: Diagnosis not present

## 2017-11-22 DIAGNOSIS — M1A09X0 Idiopathic chronic gout, multiple sites, without tophus (tophi): Secondary | ICD-10-CM | POA: Diagnosis not present

## 2017-11-22 DIAGNOSIS — M150 Primary generalized (osteo)arthritis: Secondary | ICD-10-CM | POA: Diagnosis not present

## 2017-11-22 DIAGNOSIS — L4059 Other psoriatic arthropathy: Secondary | ICD-10-CM | POA: Diagnosis not present

## 2017-11-22 DIAGNOSIS — M2550 Pain in unspecified joint: Secondary | ICD-10-CM | POA: Diagnosis not present

## 2017-11-22 DIAGNOSIS — L409 Psoriasis, unspecified: Secondary | ICD-10-CM | POA: Diagnosis not present

## 2017-11-25 ENCOUNTER — Telehealth: Payer: Self-pay | Admitting: *Deleted

## 2017-11-25 ENCOUNTER — Other Ambulatory Visit: Payer: Self-pay

## 2017-11-25 DIAGNOSIS — K7460 Unspecified cirrhosis of liver: Secondary | ICD-10-CM

## 2017-11-25 NOTE — Telephone Encounter (Signed)
See result note.  

## 2017-11-25 NOTE — Telephone Encounter (Signed)
Pt notified of results. Pt notified that he will need lab work prior to his next apt in 6 months. Orders have been put in.

## 2017-11-25 NOTE — Progress Notes (Signed)
Please let patient know his labs showed stable kidney function. Previously elevated smooth muscle ab is now normal. Liver function indicates MELD 11, likely falsely elevated in setting of chronic kidney disease.   Plan to keep ov in six months. Will update liver u/s and EGD for esophageal variceal screening at that time.  Prior to that OV, would recommend updating labs (CMET, PT/INR, AFP tumor marker).

## 2017-11-25 NOTE — Telephone Encounter (Signed)
Patient called in requesting his lab results. Routing to [place]. Please advise thanks

## 2018-01-26 ENCOUNTER — Ambulatory Visit (HOSPITAL_COMMUNITY): Payer: Medicare HMO | Admitting: Internal Medicine

## 2018-01-26 ENCOUNTER — Other Ambulatory Visit (HOSPITAL_COMMUNITY): Payer: Medicare HMO

## 2018-02-03 DIAGNOSIS — L409 Psoriasis, unspecified: Secondary | ICD-10-CM | POA: Diagnosis not present

## 2018-02-03 DIAGNOSIS — R5382 Chronic fatigue, unspecified: Secondary | ICD-10-CM | POA: Diagnosis not present

## 2018-02-03 DIAGNOSIS — G9009 Other idiopathic peripheral autonomic neuropathy: Secondary | ICD-10-CM | POA: Diagnosis not present

## 2018-02-03 DIAGNOSIS — E1165 Type 2 diabetes mellitus with hyperglycemia: Secondary | ICD-10-CM | POA: Diagnosis not present

## 2018-02-03 DIAGNOSIS — L4052 Psoriatic arthritis mutilans: Secondary | ICD-10-CM | POA: Diagnosis not present

## 2018-02-03 DIAGNOSIS — R600 Localized edema: Secondary | ICD-10-CM | POA: Diagnosis not present

## 2018-02-03 DIAGNOSIS — D649 Anemia, unspecified: Secondary | ICD-10-CM | POA: Diagnosis not present

## 2018-02-14 DIAGNOSIS — Z23 Encounter for immunization: Secondary | ICD-10-CM | POA: Diagnosis not present

## 2018-02-14 DIAGNOSIS — D519 Vitamin B12 deficiency anemia, unspecified: Secondary | ICD-10-CM | POA: Diagnosis not present

## 2018-02-14 DIAGNOSIS — D509 Iron deficiency anemia, unspecified: Secondary | ICD-10-CM | POA: Diagnosis not present

## 2018-02-14 DIAGNOSIS — G9009 Other idiopathic peripheral autonomic neuropathy: Secondary | ICD-10-CM | POA: Diagnosis not present

## 2018-02-14 DIAGNOSIS — D529 Folate deficiency anemia, unspecified: Secondary | ICD-10-CM | POA: Diagnosis not present

## 2018-02-14 DIAGNOSIS — M109 Gout, unspecified: Secondary | ICD-10-CM | POA: Diagnosis not present

## 2018-02-14 DIAGNOSIS — L409 Psoriasis, unspecified: Secondary | ICD-10-CM | POA: Diagnosis not present

## 2018-02-14 DIAGNOSIS — D649 Anemia, unspecified: Secondary | ICD-10-CM | POA: Diagnosis not present

## 2018-02-14 DIAGNOSIS — E1165 Type 2 diabetes mellitus with hyperglycemia: Secondary | ICD-10-CM | POA: Diagnosis not present

## 2018-02-14 DIAGNOSIS — Z6841 Body Mass Index (BMI) 40.0 and over, adult: Secondary | ICD-10-CM | POA: Diagnosis not present

## 2018-02-14 DIAGNOSIS — M1712 Unilateral primary osteoarthritis, left knee: Secondary | ICD-10-CM | POA: Diagnosis not present

## 2018-03-09 DIAGNOSIS — M1A09X0 Idiopathic chronic gout, multiple sites, without tophus (tophi): Secondary | ICD-10-CM | POA: Diagnosis not present

## 2018-03-09 DIAGNOSIS — L4059 Other psoriatic arthropathy: Secondary | ICD-10-CM | POA: Diagnosis not present

## 2018-03-09 DIAGNOSIS — L409 Psoriasis, unspecified: Secondary | ICD-10-CM | POA: Diagnosis not present

## 2018-03-09 DIAGNOSIS — M150 Primary generalized (osteo)arthritis: Secondary | ICD-10-CM | POA: Diagnosis not present

## 2018-03-09 DIAGNOSIS — Z6841 Body Mass Index (BMI) 40.0 and over, adult: Secondary | ICD-10-CM | POA: Diagnosis not present

## 2018-03-09 DIAGNOSIS — R5382 Chronic fatigue, unspecified: Secondary | ICD-10-CM | POA: Diagnosis not present

## 2018-03-09 DIAGNOSIS — M2550 Pain in unspecified joint: Secondary | ICD-10-CM | POA: Diagnosis not present

## 2018-04-01 ENCOUNTER — Other Ambulatory Visit: Payer: Self-pay

## 2018-04-01 DIAGNOSIS — K7460 Unspecified cirrhosis of liver: Secondary | ICD-10-CM

## 2018-05-04 DIAGNOSIS — J301 Allergic rhinitis due to pollen: Secondary | ICD-10-CM | POA: Diagnosis not present

## 2018-05-04 DIAGNOSIS — Z681 Body mass index (BMI) 19 or less, adult: Secondary | ICD-10-CM | POA: Diagnosis not present

## 2018-05-04 DIAGNOSIS — J0101 Acute recurrent maxillary sinusitis: Secondary | ICD-10-CM | POA: Diagnosis not present

## 2018-05-10 ENCOUNTER — Ambulatory Visit: Payer: Medicare HMO | Admitting: Gastroenterology

## 2018-05-26 DIAGNOSIS — R05 Cough: Secondary | ICD-10-CM | POA: Diagnosis not present

## 2018-05-26 DIAGNOSIS — J069 Acute upper respiratory infection, unspecified: Secondary | ICD-10-CM | POA: Diagnosis not present

## 2018-05-26 DIAGNOSIS — L304 Erythema intertrigo: Secondary | ICD-10-CM | POA: Diagnosis not present

## 2018-05-26 DIAGNOSIS — Z6841 Body Mass Index (BMI) 40.0 and over, adult: Secondary | ICD-10-CM | POA: Diagnosis not present

## 2018-05-30 DIAGNOSIS — Z6841 Body Mass Index (BMI) 40.0 and over, adult: Secondary | ICD-10-CM | POA: Diagnosis not present

## 2018-05-30 DIAGNOSIS — J0101 Acute recurrent maxillary sinusitis: Secondary | ICD-10-CM | POA: Diagnosis not present

## 2018-06-08 DIAGNOSIS — Z6841 Body Mass Index (BMI) 40.0 and over, adult: Secondary | ICD-10-CM | POA: Diagnosis not present

## 2018-06-08 DIAGNOSIS — J0101 Acute recurrent maxillary sinusitis: Secondary | ICD-10-CM | POA: Diagnosis not present

## 2018-06-08 DIAGNOSIS — J209 Acute bronchitis, unspecified: Secondary | ICD-10-CM | POA: Diagnosis not present

## 2018-06-10 DIAGNOSIS — L4052 Psoriatic arthritis mutilans: Secondary | ICD-10-CM | POA: Diagnosis not present

## 2018-06-10 DIAGNOSIS — E1165 Type 2 diabetes mellitus with hyperglycemia: Secondary | ICD-10-CM | POA: Diagnosis not present

## 2018-06-10 DIAGNOSIS — R5382 Chronic fatigue, unspecified: Secondary | ICD-10-CM | POA: Diagnosis not present

## 2018-06-10 DIAGNOSIS — L409 Psoriasis, unspecified: Secondary | ICD-10-CM | POA: Diagnosis not present

## 2018-06-10 DIAGNOSIS — D649 Anemia, unspecified: Secondary | ICD-10-CM | POA: Diagnosis not present

## 2018-06-10 DIAGNOSIS — R600 Localized edema: Secondary | ICD-10-CM | POA: Diagnosis not present

## 2018-06-14 DIAGNOSIS — M109 Gout, unspecified: Secondary | ICD-10-CM | POA: Diagnosis not present

## 2018-06-14 DIAGNOSIS — R600 Localized edema: Secondary | ICD-10-CM | POA: Diagnosis not present

## 2018-06-14 DIAGNOSIS — D649 Anemia, unspecified: Secondary | ICD-10-CM | POA: Diagnosis not present

## 2018-06-14 DIAGNOSIS — Z6841 Body Mass Index (BMI) 40.0 and over, adult: Secondary | ICD-10-CM | POA: Diagnosis not present

## 2018-06-14 DIAGNOSIS — L409 Psoriasis, unspecified: Secondary | ICD-10-CM | POA: Diagnosis not present

## 2018-06-14 DIAGNOSIS — L4052 Psoriatic arthritis mutilans: Secondary | ICD-10-CM | POA: Diagnosis not present

## 2018-06-14 DIAGNOSIS — G9009 Other idiopathic peripheral autonomic neuropathy: Secondary | ICD-10-CM | POA: Diagnosis not present

## 2018-06-14 DIAGNOSIS — E1165 Type 2 diabetes mellitus with hyperglycemia: Secondary | ICD-10-CM | POA: Diagnosis not present

## 2018-07-08 DIAGNOSIS — Z6841 Body Mass Index (BMI) 40.0 and over, adult: Secondary | ICD-10-CM | POA: Diagnosis not present

## 2018-07-08 DIAGNOSIS — L409 Psoriasis, unspecified: Secondary | ICD-10-CM | POA: Diagnosis not present

## 2018-07-08 DIAGNOSIS — L4059 Other psoriatic arthropathy: Secondary | ICD-10-CM | POA: Diagnosis not present

## 2018-07-08 DIAGNOSIS — M2550 Pain in unspecified joint: Secondary | ICD-10-CM | POA: Diagnosis not present

## 2018-07-08 DIAGNOSIS — M1A09X0 Idiopathic chronic gout, multiple sites, without tophus (tophi): Secondary | ICD-10-CM | POA: Diagnosis not present

## 2018-07-08 DIAGNOSIS — R5382 Chronic fatigue, unspecified: Secondary | ICD-10-CM | POA: Diagnosis not present

## 2018-07-08 DIAGNOSIS — M150 Primary generalized (osteo)arthritis: Secondary | ICD-10-CM | POA: Diagnosis not present

## 2018-07-22 IMAGING — US US ABDOMEN COMPLETE
1 series · 13 of 25 positions shown · non-contrast
Comparison: CT abdomen and pelvis 12/10/2014

CLINICAL DATA: Splenomegaly.  Cirrhosis.

EXAM:
ABDOMEN ULTRASOUND COMPLETE

[Series 1: us abdomen complete · 0.32mm/px · 13 of 109 slices shown]
[im 1/109]
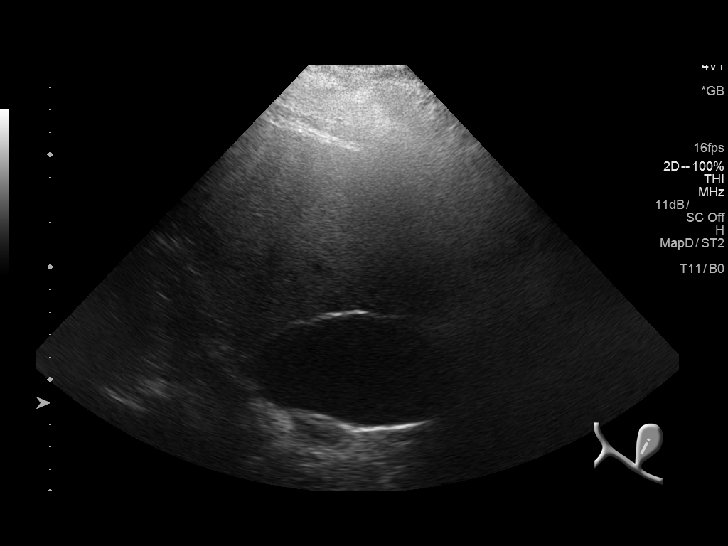
[im 10/109]
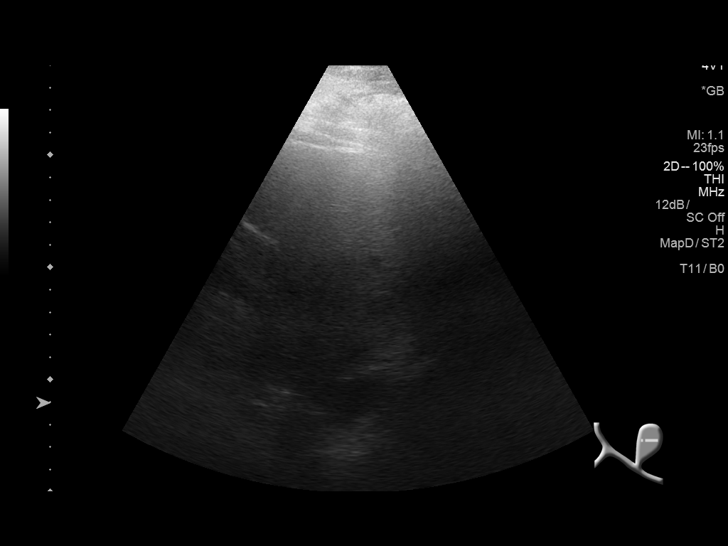
[im 19/109]
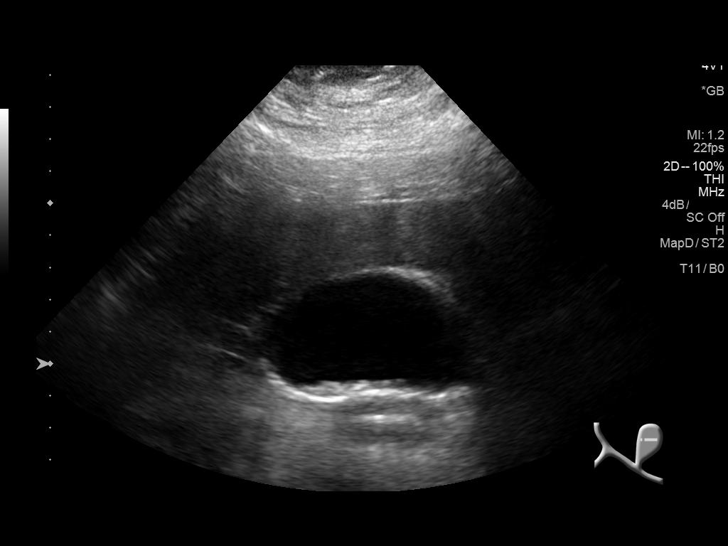
[im 28/109]
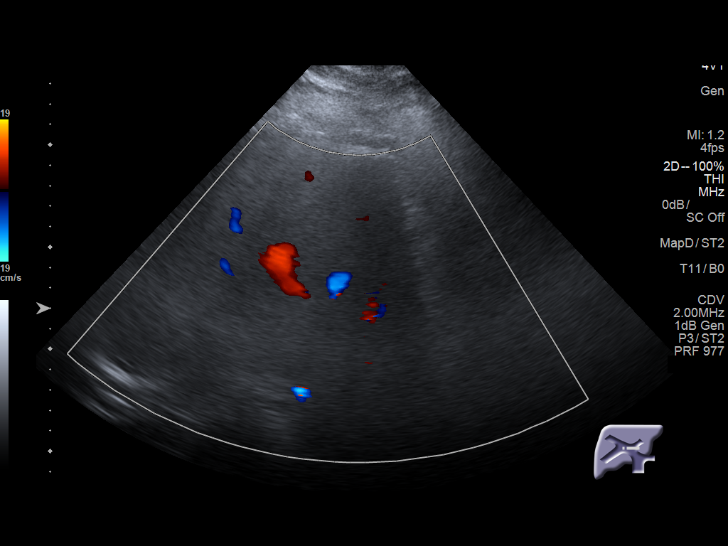
[im 37/109]
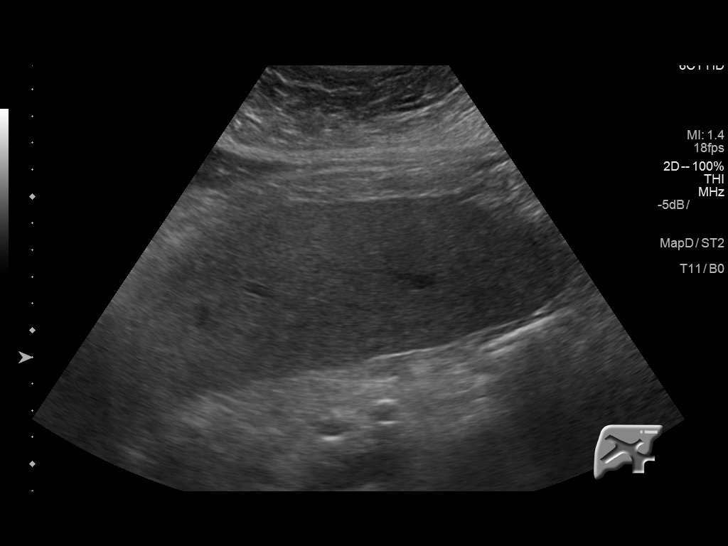
[im 46/109]
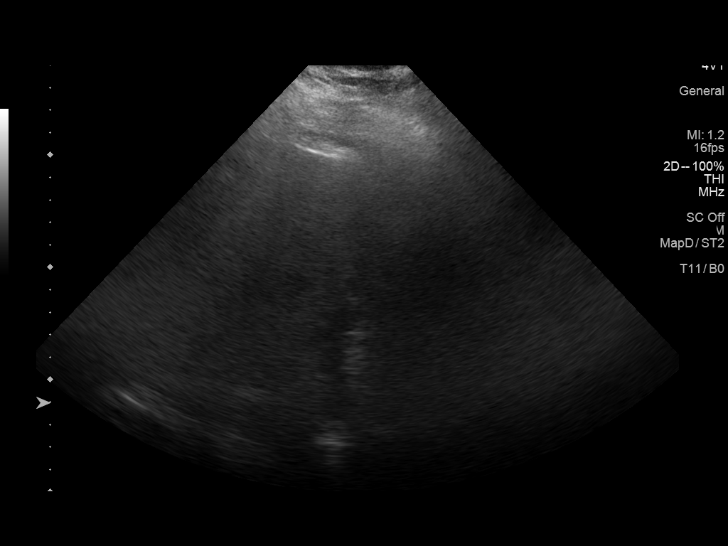
[im 55/109]
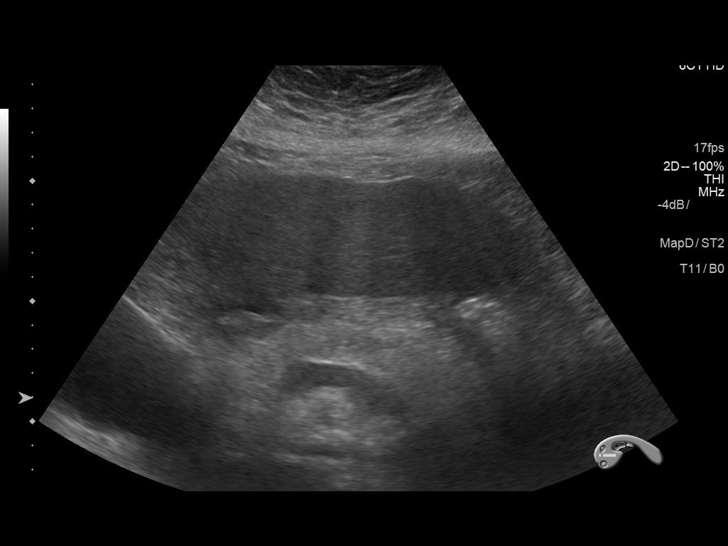
[im 64/109]
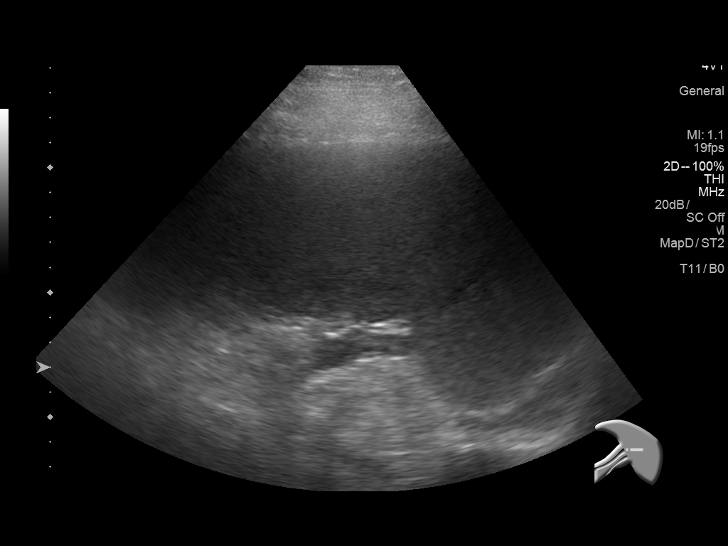
[im 73/109]
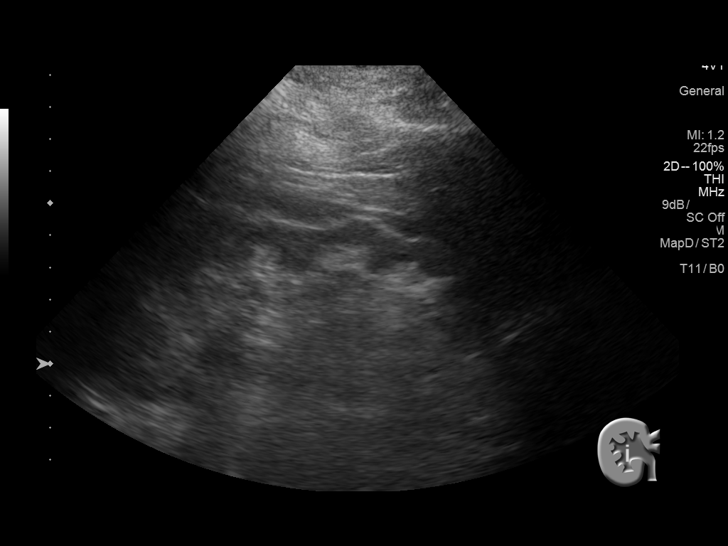
[im 82/109]
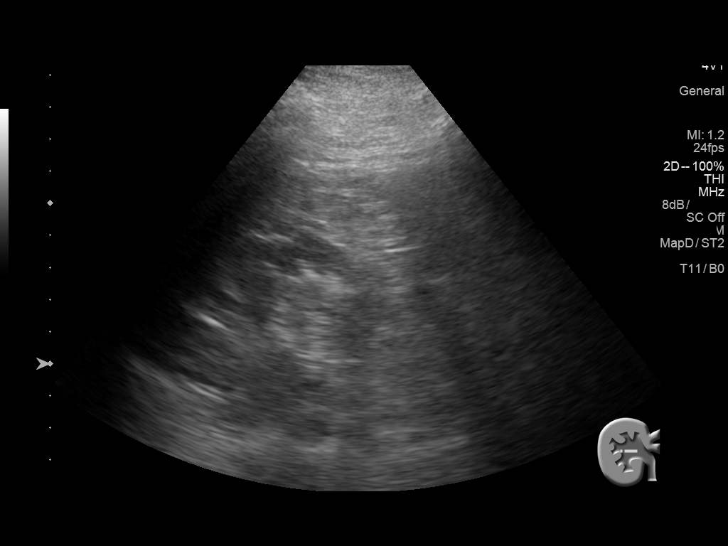
[im 91/109]
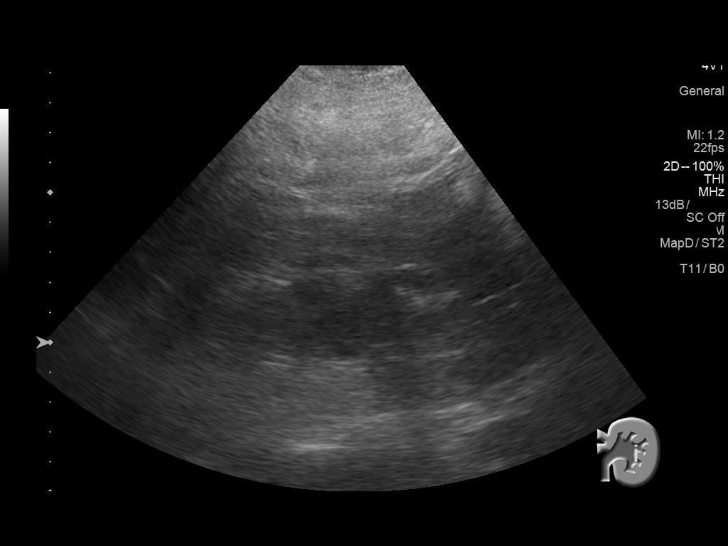
[im 100/109]
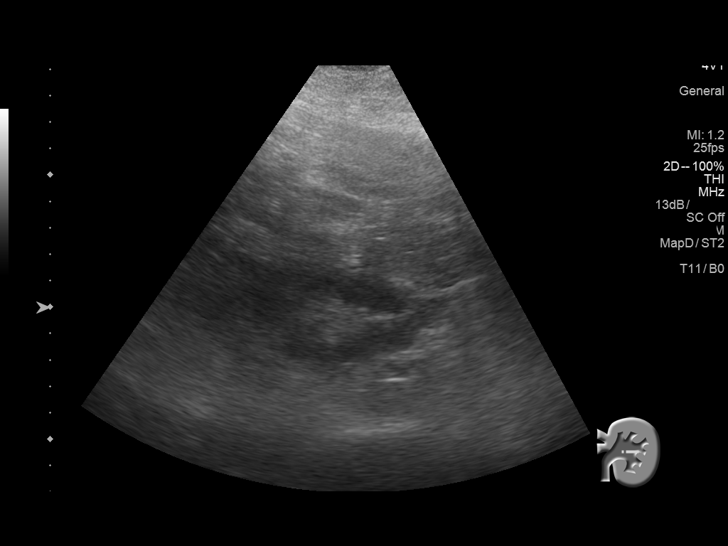
[im 109/109]
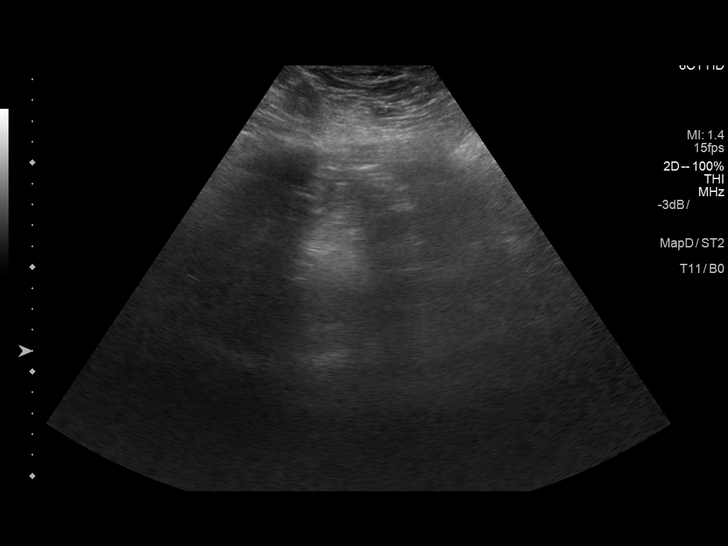

[13 of 25 positions shown; findings below may reference images not displayed]

FINDINGS: Gallbladder: No gallstones or wall thickening visualized. No
sonographic Murphy sign noted by sonographer. A small amount of
sludge is present in the gallbladder. There no discrete stones. Wall
thickness is slightly prominent at 3.3 mm. There is no sonographic
Murphy sign reported.

Common bile duct: Diameter: 4.8 mm, within normal limits.

Liver: The liver is diffusely echogenic. There is irregularity of
the liver surface compatible with known cirrhosis. No discrete
lesions are present. Portal vein is patent on color Doppler imaging
with normal direction of blood flow towards the liver.

IVC: No abnormality visualized.

Pancreas: Visualized portion unremarkable.

Spleen: The liver is enlarged, measuring 16.4 cm in maximal
diameter. Estimated volume is 3370 cm^3

Right Kidney: Length: Within normal limits. There is diffuse
parenchymal thinning and slight increased echogenicity without focal
mass lesion or stone. No hydronephrosis is present.

Left Kidney: Length: 13.3 cm, within normal limits. Mild parenchymal
thickening is present. No focal mass or stone is present. There is
no hydronephrosis.

Abdominal aorta: Poorly seen due to bowel gas. Maximal measured
diameter is 2.8 cm

Other findings: A small amount of free fluid is noted adjacent to
the right kidney.
IMPRESSION: 1. Hepatic cirrhosis with splenomegaly as described.
2. Mild ascites.
3. Renal parenchymal thinning and increased echogenicity compatible
with medical renal disease.

## 2018-11-02 DIAGNOSIS — L409 Psoriasis, unspecified: Secondary | ICD-10-CM | POA: Diagnosis not present

## 2018-11-02 DIAGNOSIS — R5382 Chronic fatigue, unspecified: Secondary | ICD-10-CM | POA: Diagnosis not present

## 2018-11-02 DIAGNOSIS — M150 Primary generalized (osteo)arthritis: Secondary | ICD-10-CM | POA: Diagnosis not present

## 2018-11-02 DIAGNOSIS — M2550 Pain in unspecified joint: Secondary | ICD-10-CM | POA: Diagnosis not present

## 2018-11-02 DIAGNOSIS — Z6841 Body Mass Index (BMI) 40.0 and over, adult: Secondary | ICD-10-CM | POA: Diagnosis not present

## 2018-11-02 DIAGNOSIS — M1A09X0 Idiopathic chronic gout, multiple sites, without tophus (tophi): Secondary | ICD-10-CM | POA: Diagnosis not present

## 2018-11-10 DIAGNOSIS — L400 Psoriasis vulgaris: Secondary | ICD-10-CM | POA: Diagnosis not present

## 2018-11-15 DIAGNOSIS — Z79899 Other long term (current) drug therapy: Secondary | ICD-10-CM | POA: Diagnosis not present

## 2018-11-15 DIAGNOSIS — L400 Psoriasis vulgaris: Secondary | ICD-10-CM | POA: Diagnosis not present

## 2018-11-17 DIAGNOSIS — M545 Low back pain: Secondary | ICD-10-CM | POA: Diagnosis not present

## 2018-11-17 DIAGNOSIS — Z6841 Body Mass Index (BMI) 40.0 and over, adult: Secondary | ICD-10-CM | POA: Diagnosis not present

## 2018-11-25 ENCOUNTER — Other Ambulatory Visit (HOSPITAL_COMMUNITY): Payer: Self-pay | Admitting: Family Medicine

## 2018-11-25 ENCOUNTER — Ambulatory Visit (HOSPITAL_COMMUNITY)
Admission: RE | Admit: 2018-11-25 | Discharge: 2018-11-25 | Disposition: A | Discharge status: Home / Self Care | Payer: Medicare HMO | Source: Ambulatory Visit | Attending: Family Medicine | Admitting: Family Medicine

## 2018-11-25 DIAGNOSIS — R52 Pain, unspecified: Secondary | ICD-10-CM

## 2018-11-25 DIAGNOSIS — Z6841 Body Mass Index (BMI) 40.0 and over, adult: Secondary | ICD-10-CM | POA: Diagnosis not present

## 2018-11-25 DIAGNOSIS — M545 Low back pain: Secondary | ICD-10-CM | POA: Diagnosis not present

## 2018-12-06 DIAGNOSIS — L304 Erythema intertrigo: Secondary | ICD-10-CM | POA: Diagnosis not present

## 2018-12-06 DIAGNOSIS — R5382 Chronic fatigue, unspecified: Secondary | ICD-10-CM | POA: Diagnosis not present

## 2018-12-06 DIAGNOSIS — G9009 Other idiopathic peripheral autonomic neuropathy: Secondary | ICD-10-CM | POA: Diagnosis not present

## 2018-12-06 DIAGNOSIS — D649 Anemia, unspecified: Secondary | ICD-10-CM | POA: Diagnosis not present

## 2018-12-06 DIAGNOSIS — E1165 Type 2 diabetes mellitus with hyperglycemia: Secondary | ICD-10-CM | POA: Diagnosis not present

## 2018-12-12 DIAGNOSIS — Z23 Encounter for immunization: Secondary | ICD-10-CM | POA: Diagnosis not present

## 2018-12-12 DIAGNOSIS — R600 Localized edema: Secondary | ICD-10-CM | POA: Diagnosis not present

## 2018-12-12 DIAGNOSIS — Z6841 Body Mass Index (BMI) 40.0 and over, adult: Secondary | ICD-10-CM | POA: Diagnosis not present

## 2018-12-12 DIAGNOSIS — M109 Gout, unspecified: Secondary | ICD-10-CM | POA: Diagnosis not present

## 2018-12-12 DIAGNOSIS — L4052 Psoriatic arthritis mutilans: Secondary | ICD-10-CM | POA: Diagnosis not present

## 2018-12-12 DIAGNOSIS — G9009 Other idiopathic peripheral autonomic neuropathy: Secondary | ICD-10-CM | POA: Diagnosis not present

## 2018-12-12 DIAGNOSIS — E1165 Type 2 diabetes mellitus with hyperglycemia: Secondary | ICD-10-CM | POA: Diagnosis not present

## 2018-12-12 DIAGNOSIS — D649 Anemia, unspecified: Secondary | ICD-10-CM | POA: Diagnosis not present

## 2019-01-30 DIAGNOSIS — J019 Acute sinusitis, unspecified: Secondary | ICD-10-CM | POA: Diagnosis not present

## 2019-01-30 DIAGNOSIS — R05 Cough: Secondary | ICD-10-CM | POA: Diagnosis not present

## 2019-01-30 DIAGNOSIS — Z6841 Body Mass Index (BMI) 40.0 and over, adult: Secondary | ICD-10-CM | POA: Diagnosis not present

## 2019-02-02 DIAGNOSIS — L400 Psoriasis vulgaris: Secondary | ICD-10-CM | POA: Diagnosis not present

## 2019-03-02 DIAGNOSIS — L400 Psoriasis vulgaris: Secondary | ICD-10-CM | POA: Diagnosis not present

## 2019-03-06 DIAGNOSIS — E1165 Type 2 diabetes mellitus with hyperglycemia: Secondary | ICD-10-CM | POA: Diagnosis not present

## 2019-03-06 DIAGNOSIS — D649 Anemia, unspecified: Secondary | ICD-10-CM | POA: Diagnosis not present

## 2019-03-06 DIAGNOSIS — R5382 Chronic fatigue, unspecified: Secondary | ICD-10-CM | POA: Diagnosis not present

## 2019-03-09 DIAGNOSIS — E1165 Type 2 diabetes mellitus with hyperglycemia: Secondary | ICD-10-CM | POA: Diagnosis not present

## 2019-03-09 DIAGNOSIS — G9009 Other idiopathic peripheral autonomic neuropathy: Secondary | ICD-10-CM | POA: Diagnosis not present

## 2019-03-09 DIAGNOSIS — D649 Anemia, unspecified: Secondary | ICD-10-CM | POA: Diagnosis not present

## 2019-03-09 DIAGNOSIS — R600 Localized edema: Secondary | ICD-10-CM | POA: Diagnosis not present

## 2019-03-09 DIAGNOSIS — M1712 Unilateral primary osteoarthritis, left knee: Secondary | ICD-10-CM | POA: Diagnosis not present

## 2019-03-09 DIAGNOSIS — M545 Low back pain: Secondary | ICD-10-CM | POA: Diagnosis not present

## 2019-03-09 DIAGNOSIS — L4052 Psoriatic arthritis mutilans: Secondary | ICD-10-CM | POA: Diagnosis not present

## 2019-03-09 DIAGNOSIS — Z6841 Body Mass Index (BMI) 40.0 and over, adult: Secondary | ICD-10-CM | POA: Diagnosis not present

## 2019-06-01 DIAGNOSIS — L400 Psoriasis vulgaris: Secondary | ICD-10-CM | POA: Diagnosis not present

## 2019-07-04 DIAGNOSIS — G9009 Other idiopathic peripheral autonomic neuropathy: Secondary | ICD-10-CM | POA: Diagnosis not present

## 2019-07-04 DIAGNOSIS — M109 Gout, unspecified: Secondary | ICD-10-CM | POA: Diagnosis not present

## 2019-07-04 DIAGNOSIS — E1165 Type 2 diabetes mellitus with hyperglycemia: Secondary | ICD-10-CM | POA: Diagnosis not present

## 2019-07-04 DIAGNOSIS — D649 Anemia, unspecified: Secondary | ICD-10-CM | POA: Diagnosis not present

## 2019-07-07 DIAGNOSIS — M109 Gout, unspecified: Secondary | ICD-10-CM | POA: Diagnosis not present

## 2019-07-07 DIAGNOSIS — M545 Low back pain: Secondary | ICD-10-CM | POA: Diagnosis not present

## 2019-07-07 DIAGNOSIS — L409 Psoriasis, unspecified: Secondary | ICD-10-CM | POA: Diagnosis not present

## 2019-07-07 DIAGNOSIS — R600 Localized edema: Secondary | ICD-10-CM | POA: Diagnosis not present

## 2019-07-07 DIAGNOSIS — E1165 Type 2 diabetes mellitus with hyperglycemia: Secondary | ICD-10-CM | POA: Diagnosis not present

## 2019-07-07 DIAGNOSIS — L4052 Psoriatic arthritis mutilans: Secondary | ICD-10-CM | POA: Diagnosis not present

## 2019-07-07 DIAGNOSIS — D649 Anemia, unspecified: Secondary | ICD-10-CM | POA: Diagnosis not present

## 2019-07-13 DIAGNOSIS — J019 Acute sinusitis, unspecified: Secondary | ICD-10-CM | POA: Diagnosis not present

## 2019-08-28 DIAGNOSIS — E782 Mixed hyperlipidemia: Secondary | ICD-10-CM | POA: Diagnosis not present

## 2019-08-28 DIAGNOSIS — I10 Essential (primary) hypertension: Secondary | ICD-10-CM | POA: Diagnosis not present

## 2019-08-31 DIAGNOSIS — L400 Psoriasis vulgaris: Secondary | ICD-10-CM | POA: Diagnosis not present

## 2019-09-27 DIAGNOSIS — E7800 Pure hypercholesterolemia, unspecified: Secondary | ICD-10-CM | POA: Diagnosis not present

## 2019-09-27 DIAGNOSIS — E1165 Type 2 diabetes mellitus with hyperglycemia: Secondary | ICD-10-CM | POA: Diagnosis not present

## 2019-10-27 DIAGNOSIS — I10 Essential (primary) hypertension: Secondary | ICD-10-CM | POA: Diagnosis not present

## 2019-10-27 DIAGNOSIS — E782 Mixed hyperlipidemia: Secondary | ICD-10-CM | POA: Diagnosis not present

## 2019-10-30 DIAGNOSIS — Z79899 Other long term (current) drug therapy: Secondary | ICD-10-CM | POA: Diagnosis not present

## 2019-10-30 DIAGNOSIS — L400 Psoriasis vulgaris: Secondary | ICD-10-CM | POA: Diagnosis not present

## 2019-11-07 DIAGNOSIS — D649 Anemia, unspecified: Secondary | ICD-10-CM | POA: Diagnosis not present

## 2019-11-07 DIAGNOSIS — R5382 Chronic fatigue, unspecified: Secondary | ICD-10-CM | POA: Diagnosis not present

## 2019-11-07 DIAGNOSIS — N400 Enlarged prostate without lower urinary tract symptoms: Secondary | ICD-10-CM | POA: Diagnosis not present

## 2019-11-07 DIAGNOSIS — M1712 Unilateral primary osteoarthritis, left knee: Secondary | ICD-10-CM | POA: Diagnosis not present

## 2019-11-07 DIAGNOSIS — E1165 Type 2 diabetes mellitus with hyperglycemia: Secondary | ICD-10-CM | POA: Diagnosis not present

## 2019-11-09 DIAGNOSIS — M545 Low back pain: Secondary | ICD-10-CM | POA: Diagnosis not present

## 2019-11-09 DIAGNOSIS — L4052 Psoriatic arthritis mutilans: Secondary | ICD-10-CM | POA: Diagnosis not present

## 2019-11-09 DIAGNOSIS — M109 Gout, unspecified: Secondary | ICD-10-CM | POA: Diagnosis not present

## 2019-11-09 DIAGNOSIS — R600 Localized edema: Secondary | ICD-10-CM | POA: Diagnosis not present

## 2019-11-09 DIAGNOSIS — Z6841 Body Mass Index (BMI) 40.0 and over, adult: Secondary | ICD-10-CM | POA: Diagnosis not present

## 2019-11-09 DIAGNOSIS — L409 Psoriasis, unspecified: Secondary | ICD-10-CM | POA: Diagnosis not present

## 2019-11-09 DIAGNOSIS — M1712 Unilateral primary osteoarthritis, left knee: Secondary | ICD-10-CM | POA: Diagnosis not present

## 2019-11-09 DIAGNOSIS — E1165 Type 2 diabetes mellitus with hyperglycemia: Secondary | ICD-10-CM | POA: Diagnosis not present

## 2019-11-20 DIAGNOSIS — Z6841 Body Mass Index (BMI) 40.0 and over, adult: Secondary | ICD-10-CM | POA: Diagnosis not present

## 2019-11-20 DIAGNOSIS — R519 Headache, unspecified: Secondary | ICD-10-CM | POA: Diagnosis not present

## 2019-11-20 DIAGNOSIS — J0101 Acute recurrent maxillary sinusitis: Secondary | ICD-10-CM | POA: Diagnosis not present

## 2019-11-30 DIAGNOSIS — L400 Psoriasis vulgaris: Secondary | ICD-10-CM | POA: Diagnosis not present

## 2019-12-28 DIAGNOSIS — E1165 Type 2 diabetes mellitus with hyperglycemia: Secondary | ICD-10-CM | POA: Diagnosis not present

## 2019-12-28 DIAGNOSIS — L409 Psoriasis, unspecified: Secondary | ICD-10-CM | POA: Diagnosis not present

## 2020-01-31 ENCOUNTER — Encounter: Payer: Self-pay | Admitting: Internal Medicine

## 2020-02-23 DIAGNOSIS — E7849 Other hyperlipidemia: Secondary | ICD-10-CM | POA: Diagnosis not present

## 2020-02-23 DIAGNOSIS — I10 Essential (primary) hypertension: Secondary | ICD-10-CM | POA: Diagnosis not present

## 2020-02-23 DIAGNOSIS — Z20828 Contact with and (suspected) exposure to other viral communicable diseases: Secondary | ICD-10-CM | POA: Diagnosis not present

## 2020-02-23 DIAGNOSIS — J0101 Acute recurrent maxillary sinusitis: Secondary | ICD-10-CM | POA: Diagnosis not present

## 2020-02-26 DIAGNOSIS — J029 Acute pharyngitis, unspecified: Secondary | ICD-10-CM | POA: Diagnosis not present

## 2020-02-26 DIAGNOSIS — R05 Cough: Secondary | ICD-10-CM | POA: Diagnosis not present

## 2020-02-26 DIAGNOSIS — Z20828 Contact with and (suspected) exposure to other viral communicable diseases: Secondary | ICD-10-CM | POA: Diagnosis not present

## 2020-02-26 DIAGNOSIS — J0101 Acute recurrent maxillary sinusitis: Secondary | ICD-10-CM | POA: Diagnosis not present

## 2020-03-04 DIAGNOSIS — E1165 Type 2 diabetes mellitus with hyperglycemia: Secondary | ICD-10-CM | POA: Diagnosis not present

## 2020-03-04 DIAGNOSIS — D649 Anemia, unspecified: Secondary | ICD-10-CM | POA: Diagnosis not present

## 2020-03-04 DIAGNOSIS — R5382 Chronic fatigue, unspecified: Secondary | ICD-10-CM | POA: Diagnosis not present

## 2020-03-04 DIAGNOSIS — Z13220 Encounter for screening for lipoid disorders: Secondary | ICD-10-CM | POA: Diagnosis not present

## 2020-03-07 DIAGNOSIS — L409 Psoriasis, unspecified: Secondary | ICD-10-CM | POA: Diagnosis not present

## 2020-03-07 DIAGNOSIS — L4052 Psoriatic arthritis mutilans: Secondary | ICD-10-CM | POA: Diagnosis not present

## 2020-03-07 DIAGNOSIS — R600 Localized edema: Secondary | ICD-10-CM | POA: Diagnosis not present

## 2020-03-07 DIAGNOSIS — G9009 Other idiopathic peripheral autonomic neuropathy: Secondary | ICD-10-CM | POA: Diagnosis not present

## 2020-03-07 DIAGNOSIS — D649 Anemia, unspecified: Secondary | ICD-10-CM | POA: Diagnosis not present

## 2020-03-07 DIAGNOSIS — M109 Gout, unspecified: Secondary | ICD-10-CM | POA: Diagnosis not present

## 2020-03-07 DIAGNOSIS — E1165 Type 2 diabetes mellitus with hyperglycemia: Secondary | ICD-10-CM | POA: Diagnosis not present

## 2020-03-07 DIAGNOSIS — Z6841 Body Mass Index (BMI) 40.0 and over, adult: Secondary | ICD-10-CM | POA: Diagnosis not present

## 2020-03-14 DIAGNOSIS — L400 Psoriasis vulgaris: Secondary | ICD-10-CM | POA: Diagnosis not present

## 2020-03-27 DIAGNOSIS — E7849 Other hyperlipidemia: Secondary | ICD-10-CM | POA: Diagnosis not present

## 2020-03-27 DIAGNOSIS — I10 Essential (primary) hypertension: Secondary | ICD-10-CM | POA: Diagnosis not present

## 2020-03-28 DIAGNOSIS — Z6841 Body Mass Index (BMI) 40.0 and over, adult: Secondary | ICD-10-CM | POA: Diagnosis not present

## 2020-03-28 DIAGNOSIS — J069 Acute upper respiratory infection, unspecified: Secondary | ICD-10-CM | POA: Diagnosis not present

## 2020-03-28 DIAGNOSIS — M545 Low back pain: Secondary | ICD-10-CM | POA: Diagnosis not present

## 2020-04-11 DIAGNOSIS — Z7984 Long term (current) use of oral hypoglycemic drugs: Secondary | ICD-10-CM | POA: Diagnosis not present

## 2020-04-11 DIAGNOSIS — J069 Acute upper respiratory infection, unspecified: Secondary | ICD-10-CM | POA: Diagnosis not present

## 2020-04-11 DIAGNOSIS — M5186 Other intervertebral disc disorders, lumbar region: Secondary | ICD-10-CM | POA: Diagnosis not present

## 2020-04-11 DIAGNOSIS — Z87891 Personal history of nicotine dependence: Secondary | ICD-10-CM | POA: Diagnosis not present

## 2020-04-11 DIAGNOSIS — E119 Type 2 diabetes mellitus without complications: Secondary | ICD-10-CM | POA: Diagnosis not present

## 2020-04-17 DIAGNOSIS — E119 Type 2 diabetes mellitus without complications: Secondary | ICD-10-CM | POA: Diagnosis not present

## 2020-04-17 DIAGNOSIS — Z7984 Long term (current) use of oral hypoglycemic drugs: Secondary | ICD-10-CM | POA: Diagnosis not present

## 2020-04-17 DIAGNOSIS — J069 Acute upper respiratory infection, unspecified: Secondary | ICD-10-CM | POA: Diagnosis not present

## 2020-04-17 DIAGNOSIS — M5186 Other intervertebral disc disorders, lumbar region: Secondary | ICD-10-CM | POA: Diagnosis not present

## 2020-04-17 DIAGNOSIS — Z87891 Personal history of nicotine dependence: Secondary | ICD-10-CM | POA: Diagnosis not present

## 2020-04-24 DIAGNOSIS — Z7984 Long term (current) use of oral hypoglycemic drugs: Secondary | ICD-10-CM | POA: Diagnosis not present

## 2020-04-24 DIAGNOSIS — Z87891 Personal history of nicotine dependence: Secondary | ICD-10-CM | POA: Diagnosis not present

## 2020-04-24 DIAGNOSIS — E119 Type 2 diabetes mellitus without complications: Secondary | ICD-10-CM | POA: Diagnosis not present

## 2020-04-24 DIAGNOSIS — J069 Acute upper respiratory infection, unspecified: Secondary | ICD-10-CM | POA: Diagnosis not present

## 2020-04-24 DIAGNOSIS — M5186 Other intervertebral disc disorders, lumbar region: Secondary | ICD-10-CM | POA: Diagnosis not present

## 2020-04-26 DIAGNOSIS — J069 Acute upper respiratory infection, unspecified: Secondary | ICD-10-CM | POA: Diagnosis not present

## 2020-04-26 DIAGNOSIS — M5186 Other intervertebral disc disorders, lumbar region: Secondary | ICD-10-CM | POA: Diagnosis not present

## 2020-04-26 DIAGNOSIS — E119 Type 2 diabetes mellitus without complications: Secondary | ICD-10-CM | POA: Diagnosis not present

## 2020-04-26 DIAGNOSIS — Z87891 Personal history of nicotine dependence: Secondary | ICD-10-CM | POA: Diagnosis not present

## 2020-04-26 DIAGNOSIS — Z7984 Long term (current) use of oral hypoglycemic drugs: Secondary | ICD-10-CM | POA: Diagnosis not present

## 2020-05-20 DIAGNOSIS — Z13220 Encounter for screening for lipoid disorders: Secondary | ICD-10-CM | POA: Diagnosis not present

## 2020-05-20 DIAGNOSIS — R5382 Chronic fatigue, unspecified: Secondary | ICD-10-CM | POA: Diagnosis not present

## 2020-05-20 DIAGNOSIS — E1165 Type 2 diabetes mellitus with hyperglycemia: Secondary | ICD-10-CM | POA: Diagnosis not present

## 2020-05-30 DIAGNOSIS — L409 Psoriasis, unspecified: Secondary | ICD-10-CM | POA: Diagnosis not present

## 2020-05-30 DIAGNOSIS — M545 Low back pain: Secondary | ICD-10-CM | POA: Diagnosis not present

## 2020-05-30 DIAGNOSIS — E1165 Type 2 diabetes mellitus with hyperglycemia: Secondary | ICD-10-CM | POA: Diagnosis not present

## 2020-05-30 DIAGNOSIS — Z6841 Body Mass Index (BMI) 40.0 and over, adult: Secondary | ICD-10-CM | POA: Diagnosis not present

## 2020-05-30 DIAGNOSIS — D649 Anemia, unspecified: Secondary | ICD-10-CM | POA: Diagnosis not present

## 2020-05-30 DIAGNOSIS — M109 Gout, unspecified: Secondary | ICD-10-CM | POA: Diagnosis not present

## 2020-05-30 DIAGNOSIS — L4052 Psoriatic arthritis mutilans: Secondary | ICD-10-CM | POA: Diagnosis not present

## 2020-05-30 DIAGNOSIS — R600 Localized edema: Secondary | ICD-10-CM | POA: Diagnosis not present

## 2020-06-13 DIAGNOSIS — L400 Psoriasis vulgaris: Secondary | ICD-10-CM | POA: Diagnosis not present

## 2020-07-18 DIAGNOSIS — K130 Diseases of lips: Secondary | ICD-10-CM | POA: Diagnosis not present

## 2020-07-29 DIAGNOSIS — Z23 Encounter for immunization: Secondary | ICD-10-CM | POA: Diagnosis not present

## 2020-08-26 DIAGNOSIS — Z23 Encounter for immunization: Secondary | ICD-10-CM | POA: Diagnosis not present

## 2020-08-27 DIAGNOSIS — E1165 Type 2 diabetes mellitus with hyperglycemia: Secondary | ICD-10-CM | POA: Diagnosis not present

## 2020-08-27 DIAGNOSIS — M1712 Unilateral primary osteoarthritis, left knee: Secondary | ICD-10-CM | POA: Diagnosis not present

## 2020-08-27 DIAGNOSIS — Z7984 Long term (current) use of oral hypoglycemic drugs: Secondary | ICD-10-CM | POA: Diagnosis not present

## 2020-09-12 DIAGNOSIS — L400 Psoriasis vulgaris: Secondary | ICD-10-CM | POA: Diagnosis not present

## 2020-09-24 DIAGNOSIS — D649 Anemia, unspecified: Secondary | ICD-10-CM | POA: Diagnosis not present

## 2020-09-24 DIAGNOSIS — E1165 Type 2 diabetes mellitus with hyperglycemia: Secondary | ICD-10-CM | POA: Diagnosis not present

## 2020-09-24 DIAGNOSIS — Z13220 Encounter for screening for lipoid disorders: Secondary | ICD-10-CM | POA: Diagnosis not present

## 2020-09-24 DIAGNOSIS — R5382 Chronic fatigue, unspecified: Secondary | ICD-10-CM | POA: Diagnosis not present

## 2020-09-26 DIAGNOSIS — Z7984 Long term (current) use of oral hypoglycemic drugs: Secondary | ICD-10-CM | POA: Diagnosis not present

## 2020-09-26 DIAGNOSIS — M1712 Unilateral primary osteoarthritis, left knee: Secondary | ICD-10-CM | POA: Diagnosis not present

## 2020-09-26 DIAGNOSIS — E1165 Type 2 diabetes mellitus with hyperglycemia: Secondary | ICD-10-CM | POA: Diagnosis not present

## 2020-09-27 DIAGNOSIS — Z1389 Encounter for screening for other disorder: Secondary | ICD-10-CM | POA: Diagnosis not present

## 2020-09-27 DIAGNOSIS — L4052 Psoriatic arthritis mutilans: Secondary | ICD-10-CM | POA: Diagnosis not present

## 2020-09-27 DIAGNOSIS — Z6841 Body Mass Index (BMI) 40.0 and over, adult: Secondary | ICD-10-CM | POA: Diagnosis not present

## 2020-09-27 DIAGNOSIS — R600 Localized edema: Secondary | ICD-10-CM | POA: Diagnosis not present

## 2020-09-27 DIAGNOSIS — Z1331 Encounter for screening for depression: Secondary | ICD-10-CM | POA: Diagnosis not present

## 2020-09-27 DIAGNOSIS — E1165 Type 2 diabetes mellitus with hyperglycemia: Secondary | ICD-10-CM | POA: Diagnosis not present

## 2020-09-27 DIAGNOSIS — M1712 Unilateral primary osteoarthritis, left knee: Secondary | ICD-10-CM | POA: Diagnosis not present

## 2020-09-27 DIAGNOSIS — K7460 Unspecified cirrhosis of liver: Secondary | ICD-10-CM | POA: Diagnosis not present

## 2020-10-07 ENCOUNTER — Telehealth: Payer: Self-pay | Admitting: Dermatology

## 2020-10-07 NOTE — Telephone Encounter (Signed)
Results, ST 

## 2020-11-26 DIAGNOSIS — M1712 Unilateral primary osteoarthritis, left knee: Secondary | ICD-10-CM | POA: Diagnosis not present

## 2020-11-26 DIAGNOSIS — Z7984 Long term (current) use of oral hypoglycemic drugs: Secondary | ICD-10-CM | POA: Diagnosis not present

## 2020-11-26 DIAGNOSIS — E1165 Type 2 diabetes mellitus with hyperglycemia: Secondary | ICD-10-CM | POA: Diagnosis not present

## 2020-11-27 DIAGNOSIS — M79671 Pain in right foot: Secondary | ICD-10-CM | POA: Diagnosis not present

## 2020-11-27 DIAGNOSIS — Z6841 Body Mass Index (BMI) 40.0 and over, adult: Secondary | ICD-10-CM | POA: Diagnosis not present

## 2020-12-03 DIAGNOSIS — J0101 Acute recurrent maxillary sinusitis: Secondary | ICD-10-CM | POA: Diagnosis not present

## 2020-12-03 DIAGNOSIS — Z6841 Body Mass Index (BMI) 40.0 and over, adult: Secondary | ICD-10-CM | POA: Diagnosis not present

## 2020-12-05 DIAGNOSIS — L400 Psoriasis vulgaris: Secondary | ICD-10-CM | POA: Diagnosis not present

## 2020-12-27 DIAGNOSIS — E1165 Type 2 diabetes mellitus with hyperglycemia: Secondary | ICD-10-CM | POA: Diagnosis not present

## 2020-12-27 DIAGNOSIS — Z7984 Long term (current) use of oral hypoglycemic drugs: Secondary | ICD-10-CM | POA: Diagnosis not present

## 2020-12-27 DIAGNOSIS — M1712 Unilateral primary osteoarthritis, left knee: Secondary | ICD-10-CM | POA: Diagnosis not present

## 2021-01-24 DIAGNOSIS — D649 Anemia, unspecified: Secondary | ICD-10-CM | POA: Diagnosis not present

## 2021-01-24 DIAGNOSIS — E1165 Type 2 diabetes mellitus with hyperglycemia: Secondary | ICD-10-CM | POA: Diagnosis not present

## 2021-01-24 DIAGNOSIS — R5382 Chronic fatigue, unspecified: Secondary | ICD-10-CM | POA: Diagnosis not present

## 2021-01-24 DIAGNOSIS — Z13220 Encounter for screening for lipoid disorders: Secondary | ICD-10-CM | POA: Diagnosis not present

## 2021-01-25 DIAGNOSIS — M172 Bilateral post-traumatic osteoarthritis of knee: Secondary | ICD-10-CM | POA: Diagnosis not present

## 2021-01-25 DIAGNOSIS — E1165 Type 2 diabetes mellitus with hyperglycemia: Secondary | ICD-10-CM | POA: Diagnosis not present

## 2021-01-25 DIAGNOSIS — Z7984 Long term (current) use of oral hypoglycemic drugs: Secondary | ICD-10-CM | POA: Diagnosis not present

## 2021-01-29 DIAGNOSIS — H5200 Hypermetropia, unspecified eye: Secondary | ICD-10-CM | POA: Diagnosis not present

## 2021-01-29 DIAGNOSIS — H2513 Age-related nuclear cataract, bilateral: Secondary | ICD-10-CM | POA: Diagnosis not present

## 2021-01-29 DIAGNOSIS — E119 Type 2 diabetes mellitus without complications: Secondary | ICD-10-CM | POA: Diagnosis not present

## 2021-01-29 DIAGNOSIS — Z0100 Encounter for examination of eyes and vision without abnormal findings: Secondary | ICD-10-CM | POA: Diagnosis not present

## 2021-01-30 DIAGNOSIS — M109 Gout, unspecified: Secondary | ICD-10-CM | POA: Diagnosis not present

## 2021-01-30 DIAGNOSIS — L409 Psoriasis, unspecified: Secondary | ICD-10-CM | POA: Diagnosis not present

## 2021-01-30 DIAGNOSIS — M1712 Unilateral primary osteoarthritis, left knee: Secondary | ICD-10-CM | POA: Diagnosis not present

## 2021-01-30 DIAGNOSIS — I10 Essential (primary) hypertension: Secondary | ICD-10-CM | POA: Diagnosis not present

## 2021-01-30 DIAGNOSIS — Z6841 Body Mass Index (BMI) 40.0 and over, adult: Secondary | ICD-10-CM | POA: Diagnosis not present

## 2021-01-30 DIAGNOSIS — D649 Anemia, unspecified: Secondary | ICD-10-CM | POA: Diagnosis not present

## 2021-01-30 DIAGNOSIS — E1165 Type 2 diabetes mellitus with hyperglycemia: Secondary | ICD-10-CM | POA: Diagnosis not present

## 2021-03-06 DIAGNOSIS — L700 Acne vulgaris: Secondary | ICD-10-CM | POA: Diagnosis not present

## 2021-03-06 DIAGNOSIS — L400 Psoriasis vulgaris: Secondary | ICD-10-CM | POA: Diagnosis not present

## 2021-03-06 DIAGNOSIS — Z79899 Other long term (current) drug therapy: Secondary | ICD-10-CM | POA: Diagnosis not present

## 2021-03-26 DIAGNOSIS — Z7984 Long term (current) use of oral hypoglycemic drugs: Secondary | ICD-10-CM | POA: Diagnosis not present

## 2021-03-26 DIAGNOSIS — M1712 Unilateral primary osteoarthritis, left knee: Secondary | ICD-10-CM | POA: Diagnosis not present

## 2021-03-26 DIAGNOSIS — E1165 Type 2 diabetes mellitus with hyperglycemia: Secondary | ICD-10-CM | POA: Diagnosis not present

## 2021-04-25 DIAGNOSIS — E1165 Type 2 diabetes mellitus with hyperglycemia: Secondary | ICD-10-CM | POA: Diagnosis not present

## 2021-04-25 DIAGNOSIS — Z1329 Encounter for screening for other suspected endocrine disorder: Secondary | ICD-10-CM | POA: Diagnosis not present

## 2021-04-25 DIAGNOSIS — R5382 Chronic fatigue, unspecified: Secondary | ICD-10-CM | POA: Diagnosis not present

## 2021-04-25 DIAGNOSIS — Z13220 Encounter for screening for lipoid disorders: Secondary | ICD-10-CM | POA: Diagnosis not present

## 2021-04-25 DIAGNOSIS — I10 Essential (primary) hypertension: Secondary | ICD-10-CM | POA: Diagnosis not present

## 2021-04-26 DIAGNOSIS — E1165 Type 2 diabetes mellitus with hyperglycemia: Secondary | ICD-10-CM | POA: Diagnosis not present

## 2021-04-26 DIAGNOSIS — Z7984 Long term (current) use of oral hypoglycemic drugs: Secondary | ICD-10-CM | POA: Diagnosis not present

## 2021-04-26 DIAGNOSIS — M1712 Unilateral primary osteoarthritis, left knee: Secondary | ICD-10-CM | POA: Diagnosis not present

## 2021-04-29 DIAGNOSIS — E1165 Type 2 diabetes mellitus with hyperglycemia: Secondary | ICD-10-CM | POA: Diagnosis not present

## 2021-04-29 DIAGNOSIS — Z0001 Encounter for general adult medical examination with abnormal findings: Secondary | ICD-10-CM | POA: Diagnosis not present

## 2021-04-29 DIAGNOSIS — M1712 Unilateral primary osteoarthritis, left knee: Secondary | ICD-10-CM | POA: Diagnosis not present

## 2021-04-29 DIAGNOSIS — L409 Psoriasis, unspecified: Secondary | ICD-10-CM | POA: Diagnosis not present

## 2021-04-29 DIAGNOSIS — I10 Essential (primary) hypertension: Secondary | ICD-10-CM | POA: Diagnosis not present

## 2021-04-29 DIAGNOSIS — Z6841 Body Mass Index (BMI) 40.0 and over, adult: Secondary | ICD-10-CM | POA: Diagnosis not present

## 2021-04-29 DIAGNOSIS — D649 Anemia, unspecified: Secondary | ICD-10-CM | POA: Diagnosis not present

## 2021-04-29 DIAGNOSIS — M109 Gout, unspecified: Secondary | ICD-10-CM | POA: Diagnosis not present

## 2021-05-26 DIAGNOSIS — Z7984 Long term (current) use of oral hypoglycemic drugs: Secondary | ICD-10-CM | POA: Diagnosis not present

## 2021-05-26 DIAGNOSIS — E1165 Type 2 diabetes mellitus with hyperglycemia: Secondary | ICD-10-CM | POA: Diagnosis not present

## 2021-05-26 DIAGNOSIS — M1712 Unilateral primary osteoarthritis, left knee: Secondary | ICD-10-CM | POA: Diagnosis not present

## 2021-05-28 DIAGNOSIS — R161 Splenomegaly, not elsewhere classified: Secondary | ICD-10-CM | POA: Diagnosis not present

## 2021-05-28 DIAGNOSIS — K7460 Unspecified cirrhosis of liver: Secondary | ICD-10-CM | POA: Diagnosis not present

## 2021-06-02 DIAGNOSIS — K7460 Unspecified cirrhosis of liver: Secondary | ICD-10-CM | POA: Diagnosis not present

## 2021-06-02 DIAGNOSIS — R161 Splenomegaly, not elsewhere classified: Secondary | ICD-10-CM | POA: Diagnosis not present

## 2021-06-02 DIAGNOSIS — R93421 Abnormal radiologic findings on diagnostic imaging of right kidney: Secondary | ICD-10-CM | POA: Diagnosis not present

## 2021-06-02 DIAGNOSIS — R93422 Abnormal radiologic findings on diagnostic imaging of left kidney: Secondary | ICD-10-CM | POA: Diagnosis not present

## 2021-06-02 DIAGNOSIS — N261 Atrophy of kidney (terminal): Secondary | ICD-10-CM | POA: Diagnosis not present

## 2021-06-05 DIAGNOSIS — L400 Psoriasis vulgaris: Secondary | ICD-10-CM | POA: Diagnosis not present

## 2021-06-11 DIAGNOSIS — Z6841 Body Mass Index (BMI) 40.0 and over, adult: Secondary | ICD-10-CM | POA: Diagnosis not present

## 2021-06-11 DIAGNOSIS — M25522 Pain in left elbow: Secondary | ICD-10-CM | POA: Diagnosis not present

## 2021-06-26 DIAGNOSIS — Z7984 Long term (current) use of oral hypoglycemic drugs: Secondary | ICD-10-CM | POA: Diagnosis not present

## 2021-06-26 DIAGNOSIS — E1165 Type 2 diabetes mellitus with hyperglycemia: Secondary | ICD-10-CM | POA: Diagnosis not present

## 2021-06-26 DIAGNOSIS — M1712 Unilateral primary osteoarthritis, left knee: Secondary | ICD-10-CM | POA: Diagnosis not present

## 2021-07-02 DIAGNOSIS — D649 Anemia, unspecified: Secondary | ICD-10-CM | POA: Diagnosis not present

## 2021-07-02 DIAGNOSIS — D696 Thrombocytopenia, unspecified: Secondary | ICD-10-CM | POA: Diagnosis not present

## 2021-07-02 DIAGNOSIS — R161 Splenomegaly, not elsewhere classified: Secondary | ICD-10-CM | POA: Diagnosis not present

## 2021-07-02 DIAGNOSIS — K7460 Unspecified cirrhosis of liver: Secondary | ICD-10-CM | POA: Diagnosis not present

## 2021-07-02 DIAGNOSIS — Z1159 Encounter for screening for other viral diseases: Secondary | ICD-10-CM | POA: Diagnosis not present

## 2021-07-02 DIAGNOSIS — D509 Iron deficiency anemia, unspecified: Secondary | ICD-10-CM | POA: Diagnosis not present

## 2021-07-27 DIAGNOSIS — Z7984 Long term (current) use of oral hypoglycemic drugs: Secondary | ICD-10-CM | POA: Diagnosis not present

## 2021-07-27 DIAGNOSIS — E1165 Type 2 diabetes mellitus with hyperglycemia: Secondary | ICD-10-CM | POA: Diagnosis not present

## 2021-07-27 DIAGNOSIS — M1712 Unilateral primary osteoarthritis, left knee: Secondary | ICD-10-CM | POA: Diagnosis not present

## 2021-07-30 DIAGNOSIS — I10 Essential (primary) hypertension: Secondary | ICD-10-CM | POA: Diagnosis not present

## 2021-07-30 DIAGNOSIS — Z1329 Encounter for screening for other suspected endocrine disorder: Secondary | ICD-10-CM | POA: Diagnosis not present

## 2021-07-30 DIAGNOSIS — E1165 Type 2 diabetes mellitus with hyperglycemia: Secondary | ICD-10-CM | POA: Diagnosis not present

## 2021-07-30 DIAGNOSIS — R5382 Chronic fatigue, unspecified: Secondary | ICD-10-CM | POA: Diagnosis not present

## 2021-07-30 DIAGNOSIS — Z13220 Encounter for screening for lipoid disorders: Secondary | ICD-10-CM | POA: Diagnosis not present

## 2021-08-01 DIAGNOSIS — D649 Anemia, unspecified: Secondary | ICD-10-CM | POA: Diagnosis not present

## 2021-08-01 DIAGNOSIS — I10 Essential (primary) hypertension: Secondary | ICD-10-CM | POA: Diagnosis not present

## 2021-08-01 DIAGNOSIS — M1712 Unilateral primary osteoarthritis, left knee: Secondary | ICD-10-CM | POA: Diagnosis not present

## 2021-08-01 DIAGNOSIS — Z6841 Body Mass Index (BMI) 40.0 and over, adult: Secondary | ICD-10-CM | POA: Diagnosis not present

## 2021-08-01 DIAGNOSIS — L409 Psoriasis, unspecified: Secondary | ICD-10-CM | POA: Diagnosis not present

## 2021-08-01 DIAGNOSIS — M109 Gout, unspecified: Secondary | ICD-10-CM | POA: Diagnosis not present

## 2021-08-01 DIAGNOSIS — E1165 Type 2 diabetes mellitus with hyperglycemia: Secondary | ICD-10-CM | POA: Diagnosis not present

## 2021-09-10 DIAGNOSIS — S93401A Sprain of unspecified ligament of right ankle, initial encounter: Secondary | ICD-10-CM | POA: Diagnosis not present

## 2021-09-10 DIAGNOSIS — M25571 Pain in right ankle and joints of right foot: Secondary | ICD-10-CM | POA: Diagnosis not present

## 2021-09-11 DIAGNOSIS — L400 Psoriasis vulgaris: Secondary | ICD-10-CM | POA: Diagnosis not present

## 2021-09-26 DIAGNOSIS — E1165 Type 2 diabetes mellitus with hyperglycemia: Secondary | ICD-10-CM | POA: Diagnosis not present

## 2021-09-26 DIAGNOSIS — M1712 Unilateral primary osteoarthritis, left knee: Secondary | ICD-10-CM | POA: Diagnosis not present

## 2021-09-26 DIAGNOSIS — Z7984 Long term (current) use of oral hypoglycemic drugs: Secondary | ICD-10-CM | POA: Diagnosis not present

## 2021-11-28 DIAGNOSIS — Z13220 Encounter for screening for lipoid disorders: Secondary | ICD-10-CM | POA: Diagnosis not present

## 2021-11-28 DIAGNOSIS — R5382 Chronic fatigue, unspecified: Secondary | ICD-10-CM | POA: Diagnosis not present

## 2021-11-28 DIAGNOSIS — E1165 Type 2 diabetes mellitus with hyperglycemia: Secondary | ICD-10-CM | POA: Diagnosis not present

## 2021-11-28 DIAGNOSIS — Z1329 Encounter for screening for other suspected endocrine disorder: Secondary | ICD-10-CM | POA: Diagnosis not present

## 2021-12-02 DIAGNOSIS — E875 Hyperkalemia: Secondary | ICD-10-CM | POA: Diagnosis not present

## 2021-12-02 DIAGNOSIS — D649 Anemia, unspecified: Secondary | ICD-10-CM | POA: Diagnosis not present

## 2021-12-02 DIAGNOSIS — Z6841 Body Mass Index (BMI) 40.0 and over, adult: Secondary | ICD-10-CM | POA: Diagnosis not present

## 2021-12-02 DIAGNOSIS — L409 Psoriasis, unspecified: Secondary | ICD-10-CM | POA: Diagnosis not present

## 2021-12-02 DIAGNOSIS — E1165 Type 2 diabetes mellitus with hyperglycemia: Secondary | ICD-10-CM | POA: Diagnosis not present

## 2021-12-02 DIAGNOSIS — M109 Gout, unspecified: Secondary | ICD-10-CM | POA: Diagnosis not present

## 2021-12-02 DIAGNOSIS — M1712 Unilateral primary osteoarthritis, left knee: Secondary | ICD-10-CM | POA: Diagnosis not present

## 2021-12-02 DIAGNOSIS — I10 Essential (primary) hypertension: Secondary | ICD-10-CM | POA: Diagnosis not present

## 2021-12-11 DIAGNOSIS — L400 Psoriasis vulgaris: Secondary | ICD-10-CM | POA: Diagnosis not present

## 2021-12-26 DIAGNOSIS — M1712 Unilateral primary osteoarthritis, left knee: Secondary | ICD-10-CM | POA: Diagnosis not present

## 2021-12-26 DIAGNOSIS — E1165 Type 2 diabetes mellitus with hyperglycemia: Secondary | ICD-10-CM | POA: Diagnosis not present

## 2021-12-26 DIAGNOSIS — Z7984 Long term (current) use of oral hypoglycemic drugs: Secondary | ICD-10-CM | POA: Diagnosis not present

## 2022-02-03 DIAGNOSIS — Z0100 Encounter for examination of eyes and vision without abnormal findings: Secondary | ICD-10-CM | POA: Diagnosis not present

## 2022-02-03 DIAGNOSIS — E119 Type 2 diabetes mellitus without complications: Secondary | ICD-10-CM | POA: Diagnosis not present

## 2022-02-03 DIAGNOSIS — E7800 Pure hypercholesterolemia, unspecified: Secondary | ICD-10-CM | POA: Diagnosis not present

## 2022-02-03 DIAGNOSIS — H5200 Hypermetropia, unspecified eye: Secondary | ICD-10-CM | POA: Diagnosis not present

## 2022-02-25 DIAGNOSIS — Z79899 Other long term (current) drug therapy: Secondary | ICD-10-CM | POA: Diagnosis not present

## 2022-02-25 DIAGNOSIS — L400 Psoriasis vulgaris: Secondary | ICD-10-CM | POA: Diagnosis not present

## 2022-03-12 DIAGNOSIS — L400 Psoriasis vulgaris: Secondary | ICD-10-CM | POA: Diagnosis not present

## 2022-03-27 DIAGNOSIS — E1165 Type 2 diabetes mellitus with hyperglycemia: Secondary | ICD-10-CM | POA: Diagnosis not present

## 2022-03-27 DIAGNOSIS — E782 Mixed hyperlipidemia: Secondary | ICD-10-CM | POA: Diagnosis not present

## 2022-04-01 DIAGNOSIS — Z1329 Encounter for screening for other suspected endocrine disorder: Secondary | ICD-10-CM | POA: Diagnosis not present

## 2022-04-01 DIAGNOSIS — E782 Mixed hyperlipidemia: Secondary | ICD-10-CM | POA: Diagnosis not present

## 2022-04-01 DIAGNOSIS — E7849 Other hyperlipidemia: Secondary | ICD-10-CM | POA: Diagnosis not present

## 2022-04-01 DIAGNOSIS — Z13220 Encounter for screening for lipoid disorders: Secondary | ICD-10-CM | POA: Diagnosis not present

## 2022-04-01 DIAGNOSIS — R5382 Chronic fatigue, unspecified: Secondary | ICD-10-CM | POA: Diagnosis not present

## 2022-04-01 DIAGNOSIS — E1165 Type 2 diabetes mellitus with hyperglycemia: Secondary | ICD-10-CM | POA: Diagnosis not present

## 2022-04-01 DIAGNOSIS — I10 Essential (primary) hypertension: Secondary | ICD-10-CM | POA: Diagnosis not present

## 2022-04-13 DIAGNOSIS — D649 Anemia, unspecified: Secondary | ICD-10-CM | POA: Diagnosis not present

## 2022-04-13 DIAGNOSIS — L409 Psoriasis, unspecified: Secondary | ICD-10-CM | POA: Diagnosis not present

## 2022-04-13 DIAGNOSIS — M109 Gout, unspecified: Secondary | ICD-10-CM | POA: Diagnosis not present

## 2022-04-13 DIAGNOSIS — M1712 Unilateral primary osteoarthritis, left knee: Secondary | ICD-10-CM | POA: Diagnosis not present

## 2022-04-13 DIAGNOSIS — I10 Essential (primary) hypertension: Secondary | ICD-10-CM | POA: Diagnosis not present

## 2022-04-13 DIAGNOSIS — E1165 Type 2 diabetes mellitus with hyperglycemia: Secondary | ICD-10-CM | POA: Diagnosis not present

## 2022-04-13 DIAGNOSIS — Z6841 Body Mass Index (BMI) 40.0 and over, adult: Secondary | ICD-10-CM | POA: Diagnosis not present

## 2022-07-01 DIAGNOSIS — Z1329 Encounter for screening for other suspected endocrine disorder: Secondary | ICD-10-CM | POA: Diagnosis not present

## 2022-07-01 DIAGNOSIS — D519 Vitamin B12 deficiency anemia, unspecified: Secondary | ICD-10-CM | POA: Diagnosis not present

## 2022-07-01 DIAGNOSIS — E1165 Type 2 diabetes mellitus with hyperglycemia: Secondary | ICD-10-CM | POA: Diagnosis not present

## 2022-07-01 DIAGNOSIS — D649 Anemia, unspecified: Secondary | ICD-10-CM | POA: Diagnosis not present

## 2022-07-01 DIAGNOSIS — R5382 Chronic fatigue, unspecified: Secondary | ICD-10-CM | POA: Diagnosis not present

## 2022-07-01 DIAGNOSIS — D529 Folate deficiency anemia, unspecified: Secondary | ICD-10-CM | POA: Diagnosis not present

## 2022-07-01 DIAGNOSIS — E7849 Other hyperlipidemia: Secondary | ICD-10-CM | POA: Diagnosis not present

## 2022-07-01 DIAGNOSIS — I10 Essential (primary) hypertension: Secondary | ICD-10-CM | POA: Diagnosis not present

## 2022-07-01 DIAGNOSIS — E782 Mixed hyperlipidemia: Secondary | ICD-10-CM | POA: Diagnosis not present

## 2022-07-01 DIAGNOSIS — E875 Hyperkalemia: Secondary | ICD-10-CM | POA: Diagnosis not present

## 2022-07-07 DIAGNOSIS — Z6841 Body Mass Index (BMI) 40.0 and over, adult: Secondary | ICD-10-CM | POA: Diagnosis not present

## 2022-07-07 DIAGNOSIS — M109 Gout, unspecified: Secondary | ICD-10-CM | POA: Diagnosis not present

## 2022-07-07 DIAGNOSIS — E1165 Type 2 diabetes mellitus with hyperglycemia: Secondary | ICD-10-CM | POA: Diagnosis not present

## 2022-07-07 DIAGNOSIS — Z1331 Encounter for screening for depression: Secondary | ICD-10-CM | POA: Diagnosis not present

## 2022-07-07 DIAGNOSIS — Z1389 Encounter for screening for other disorder: Secondary | ICD-10-CM | POA: Diagnosis not present

## 2022-07-07 DIAGNOSIS — L409 Psoriasis, unspecified: Secondary | ICD-10-CM | POA: Diagnosis not present

## 2022-07-07 DIAGNOSIS — I10 Essential (primary) hypertension: Secondary | ICD-10-CM | POA: Diagnosis not present

## 2022-07-07 DIAGNOSIS — D649 Anemia, unspecified: Secondary | ICD-10-CM | POA: Diagnosis not present

## 2022-07-07 DIAGNOSIS — M1712 Unilateral primary osteoarthritis, left knee: Secondary | ICD-10-CM | POA: Diagnosis not present

## 2022-07-27 DIAGNOSIS — E782 Mixed hyperlipidemia: Secondary | ICD-10-CM | POA: Diagnosis not present

## 2022-07-27 DIAGNOSIS — E1165 Type 2 diabetes mellitus with hyperglycemia: Secondary | ICD-10-CM | POA: Diagnosis not present

## 2022-08-27 DIAGNOSIS — E782 Mixed hyperlipidemia: Secondary | ICD-10-CM | POA: Diagnosis not present

## 2022-08-27 DIAGNOSIS — I10 Essential (primary) hypertension: Secondary | ICD-10-CM | POA: Diagnosis not present

## 2022-09-10 DIAGNOSIS — L400 Psoriasis vulgaris: Secondary | ICD-10-CM | POA: Diagnosis not present

## 2022-10-15 ENCOUNTER — Telehealth: Payer: Self-pay | Admitting: *Deleted

## 2022-10-15 NOTE — Chronic Care Management (AMB) (Signed)
  Care Coordination  Outreach Note  10/15/2022 Name: Jacob Maldonado MRN: [id number] DOB: 05/18/1951   Care Coordination Outreach Attempts: An unsuccessful telephone outreach was attempted today to offer the patient information about available care coordination services as a benefit of their health plan.   Follow Up Plan:  No further outreach attempts will be made at this time. We have been unable to contact the patient to offer or enroll patient in care coordination services  Encounter Outcome:  No Answer Stated this is wrong number.   [place]  Direct Dial: [phone]

## 2022-10-28 DIAGNOSIS — R5382 Chronic fatigue, unspecified: Secondary | ICD-10-CM | POA: Diagnosis not present

## 2022-10-28 DIAGNOSIS — D649 Anemia, unspecified: Secondary | ICD-10-CM | POA: Diagnosis not present

## 2022-10-28 DIAGNOSIS — E7849 Other hyperlipidemia: Secondary | ICD-10-CM | POA: Diagnosis not present

## 2022-10-28 DIAGNOSIS — E782 Mixed hyperlipidemia: Secondary | ICD-10-CM | POA: Diagnosis not present

## 2022-10-28 DIAGNOSIS — E1165 Type 2 diabetes mellitus with hyperglycemia: Secondary | ICD-10-CM | POA: Diagnosis not present

## 2022-10-28 DIAGNOSIS — E875 Hyperkalemia: Secondary | ICD-10-CM | POA: Diagnosis not present

## 2022-11-04 DIAGNOSIS — E875 Hyperkalemia: Secondary | ICD-10-CM | POA: Diagnosis not present

## 2022-11-04 DIAGNOSIS — D649 Anemia, unspecified: Secondary | ICD-10-CM | POA: Diagnosis not present

## 2022-11-04 DIAGNOSIS — Z6841 Body Mass Index (BMI) 40.0 and over, adult: Secondary | ICD-10-CM | POA: Diagnosis not present

## 2022-11-04 DIAGNOSIS — E1165 Type 2 diabetes mellitus with hyperglycemia: Secondary | ICD-10-CM | POA: Diagnosis not present

## 2022-11-04 DIAGNOSIS — M1712 Unilateral primary osteoarthritis, left knee: Secondary | ICD-10-CM | POA: Diagnosis not present

## 2022-11-04 DIAGNOSIS — L409 Psoriasis, unspecified: Secondary | ICD-10-CM | POA: Diagnosis not present

## 2022-11-04 DIAGNOSIS — I10 Essential (primary) hypertension: Secondary | ICD-10-CM | POA: Diagnosis not present

## 2022-11-04 DIAGNOSIS — M109 Gout, unspecified: Secondary | ICD-10-CM | POA: Diagnosis not present

## 2022-11-04 DIAGNOSIS — H6121 Impacted cerumen, right ear: Secondary | ICD-10-CM | POA: Diagnosis not present

## 2022-11-25 DIAGNOSIS — Z6841 Body Mass Index (BMI) 40.0 and over, adult: Secondary | ICD-10-CM | POA: Diagnosis not present

## 2022-11-25 DIAGNOSIS — J0101 Acute recurrent maxillary sinusitis: Secondary | ICD-10-CM | POA: Diagnosis not present

## 2022-11-25 DIAGNOSIS — Z20828 Contact with and (suspected) exposure to other viral communicable diseases: Secondary | ICD-10-CM | POA: Diagnosis not present

## 2022-11-25 DIAGNOSIS — R030 Elevated blood-pressure reading, without diagnosis of hypertension: Secondary | ICD-10-CM | POA: Diagnosis not present

## 2022-12-10 DIAGNOSIS — L400 Psoriasis vulgaris: Secondary | ICD-10-CM | POA: Diagnosis not present

## 2022-12-27 DIAGNOSIS — I10 Essential (primary) hypertension: Secondary | ICD-10-CM | POA: Diagnosis not present

## 2022-12-27 DIAGNOSIS — E1165 Type 2 diabetes mellitus with hyperglycemia: Secondary | ICD-10-CM | POA: Diagnosis not present

## 2022-12-27 DIAGNOSIS — E782 Mixed hyperlipidemia: Secondary | ICD-10-CM | POA: Diagnosis not present

## 2022-12-29 DIAGNOSIS — R030 Elevated blood-pressure reading, without diagnosis of hypertension: Secondary | ICD-10-CM | POA: Diagnosis not present

## 2022-12-29 DIAGNOSIS — U071 COVID-19: Secondary | ICD-10-CM | POA: Diagnosis not present

## 2022-12-29 DIAGNOSIS — Z6841 Body Mass Index (BMI) 40.0 and over, adult: Secondary | ICD-10-CM | POA: Diagnosis not present

## 2023-01-04 DIAGNOSIS — Z20828 Contact with and (suspected) exposure to other viral communicable diseases: Secondary | ICD-10-CM | POA: Diagnosis not present

## 2023-01-04 DIAGNOSIS — R030 Elevated blood-pressure reading, without diagnosis of hypertension: Secondary | ICD-10-CM | POA: Diagnosis not present

## 2023-01-04 DIAGNOSIS — Z6841 Body Mass Index (BMI) 40.0 and over, adult: Secondary | ICD-10-CM | POA: Diagnosis not present

## 2023-01-27 DIAGNOSIS — D649 Anemia, unspecified: Secondary | ICD-10-CM | POA: Diagnosis not present

## 2023-01-27 DIAGNOSIS — D529 Folate deficiency anemia, unspecified: Secondary | ICD-10-CM | POA: Diagnosis not present

## 2023-01-27 DIAGNOSIS — E782 Mixed hyperlipidemia: Secondary | ICD-10-CM | POA: Diagnosis not present

## 2023-01-27 DIAGNOSIS — D519 Vitamin B12 deficiency anemia, unspecified: Secondary | ICD-10-CM | POA: Diagnosis not present

## 2023-01-27 DIAGNOSIS — I10 Essential (primary) hypertension: Secondary | ICD-10-CM | POA: Diagnosis not present

## 2023-01-27 DIAGNOSIS — E7849 Other hyperlipidemia: Secondary | ICD-10-CM | POA: Diagnosis not present

## 2023-01-27 DIAGNOSIS — Z1329 Encounter for screening for other suspected endocrine disorder: Secondary | ICD-10-CM | POA: Diagnosis not present

## 2023-01-27 DIAGNOSIS — E875 Hyperkalemia: Secondary | ICD-10-CM | POA: Diagnosis not present

## 2023-01-27 DIAGNOSIS — E1165 Type 2 diabetes mellitus with hyperglycemia: Secondary | ICD-10-CM | POA: Diagnosis not present

## 2023-02-04 DIAGNOSIS — Z6841 Body Mass Index (BMI) 40.0 and over, adult: Secondary | ICD-10-CM | POA: Diagnosis not present

## 2023-02-04 DIAGNOSIS — M1712 Unilateral primary osteoarthritis, left knee: Secondary | ICD-10-CM | POA: Diagnosis not present

## 2023-02-04 DIAGNOSIS — D649 Anemia, unspecified: Secondary | ICD-10-CM | POA: Diagnosis not present

## 2023-02-04 DIAGNOSIS — M109 Gout, unspecified: Secondary | ICD-10-CM | POA: Diagnosis not present

## 2023-02-04 DIAGNOSIS — L409 Psoriasis, unspecified: Secondary | ICD-10-CM | POA: Diagnosis not present

## 2023-02-04 DIAGNOSIS — E1165 Type 2 diabetes mellitus with hyperglycemia: Secondary | ICD-10-CM | POA: Diagnosis not present

## 2023-02-04 DIAGNOSIS — I10 Essential (primary) hypertension: Secondary | ICD-10-CM | POA: Diagnosis not present

## 2023-04-01 DIAGNOSIS — L400 Psoriasis vulgaris: Secondary | ICD-10-CM | POA: Diagnosis not present

## 2023-04-02 DIAGNOSIS — L400 Psoriasis vulgaris: Secondary | ICD-10-CM | POA: Diagnosis not present

## 2023-04-02 DIAGNOSIS — Z79899 Other long term (current) drug therapy: Secondary | ICD-10-CM | POA: Diagnosis not present

## 2023-05-06 DIAGNOSIS — R5382 Chronic fatigue, unspecified: Secondary | ICD-10-CM | POA: Diagnosis not present

## 2023-05-06 DIAGNOSIS — E1165 Type 2 diabetes mellitus with hyperglycemia: Secondary | ICD-10-CM | POA: Diagnosis not present

## 2023-05-06 DIAGNOSIS — D649 Anemia, unspecified: Secondary | ICD-10-CM | POA: Diagnosis not present

## 2023-05-06 DIAGNOSIS — E875 Hyperkalemia: Secondary | ICD-10-CM | POA: Diagnosis not present

## 2023-05-06 DIAGNOSIS — E7849 Other hyperlipidemia: Secondary | ICD-10-CM | POA: Diagnosis not present

## 2023-05-06 DIAGNOSIS — E782 Mixed hyperlipidemia: Secondary | ICD-10-CM | POA: Diagnosis not present

## 2023-05-13 DIAGNOSIS — L409 Psoriasis, unspecified: Secondary | ICD-10-CM | POA: Diagnosis not present

## 2023-05-13 DIAGNOSIS — I10 Essential (primary) hypertension: Secondary | ICD-10-CM | POA: Diagnosis not present

## 2023-05-13 DIAGNOSIS — Z6841 Body Mass Index (BMI) 40.0 and over, adult: Secondary | ICD-10-CM | POA: Diagnosis not present

## 2023-05-13 DIAGNOSIS — D649 Anemia, unspecified: Secondary | ICD-10-CM | POA: Diagnosis not present

## 2023-05-13 DIAGNOSIS — M1712 Unilateral primary osteoarthritis, left knee: Secondary | ICD-10-CM | POA: Diagnosis not present

## 2023-05-13 DIAGNOSIS — E1165 Type 2 diabetes mellitus with hyperglycemia: Secondary | ICD-10-CM | POA: Diagnosis not present

## 2023-05-13 DIAGNOSIS — M109 Gout, unspecified: Secondary | ICD-10-CM | POA: Diagnosis not present

## 2023-05-17 DIAGNOSIS — N189 Chronic kidney disease, unspecified: Secondary | ICD-10-CM | POA: Insufficient documentation
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
## 2023-05-18 ENCOUNTER — Inpatient Hospital Stay: Payer: Medicare PPO | Attending: Hematology | Admitting: Hematology

## 2023-05-18 VITALS — BP 138/72 | HR 78 | Temp 97.8°F | Resp 18 | Ht 71.0 in | Wt 307.2 lb

## 2023-05-18 DIAGNOSIS — D649 Anemia, unspecified: Secondary | ICD-10-CM

## 2023-05-18 DIAGNOSIS — E1122 Type 2 diabetes mellitus with diabetic chronic kidney disease: Secondary | ICD-10-CM | POA: Diagnosis not present

## 2023-05-18 DIAGNOSIS — D631 Anemia in chronic kidney disease: Secondary | ICD-10-CM | POA: Diagnosis not present

## 2023-05-18 DIAGNOSIS — N189 Chronic kidney disease, unspecified: Secondary | ICD-10-CM

## 2023-05-18 DIAGNOSIS — N1832 Chronic kidney disease, stage 3b: Secondary | ICD-10-CM

## 2023-05-18 DIAGNOSIS — D509 Iron deficiency anemia, unspecified: Secondary | ICD-10-CM | POA: Insufficient documentation

## 2023-05-18 DIAGNOSIS — D696 Thrombocytopenia, unspecified: Secondary | ICD-10-CM

## 2023-05-18 NOTE — Patient Instructions (Addendum)
[facility]  Discharge Instructions  You were seen and examined today by Dr. Ellin Saba. Dr. Ellin Saba is a hematologist, meaning that he specializes in blood abnormalities. Dr. Ellin Saba discussed your past medical history, family history of cancers/blood conditions and the events that led to you being here today.  You were referred to Dr. Ellin Saba due to anemia.  Dr. Ellin Saba has recommended an iron infusion. The day of your iron infusion, take 650mg  of Tylenol and 25mg  Benadryl 30 minutes before you come for your appointment.  Follow-up as scheduled.  Thank you for choosing [facility] to provide your oncology and hematology care.   To afford each patient quality time with our provider, please arrive at least 15 minutes before your scheduled appointment time. You may need to reschedule your appointment if you arrive late (10 or more minutes). Arriving late affects you and other patients whose appointments are after yours.  Also, if you miss three or more appointments without notifying the office, you may be dismissed from the clinic at the provider's discretion.    Again, thank you for choosing [facility].  Our hope is that these requests will decrease the amount of time that you wait before being seen by our physicians.   If you have a lab appointment with the Cancer Center - please note that after April 8th, all labs will be drawn in the cancer center.  You do not have to check in or register with the main entrance as you have in the past but will complete your check-in at the cancer center.            _____________________________________________________________  Should you have questions after your visit to [facility], please contact our office at [phone] and follow the prompts.  Our office hours are 8:00 a.m. to 4:30 p.m. Monday - Thursday and 8:00 a.m. to 2:30 p.m. Friday.  Please note that  voicemails left after 4:00 p.m. may not be returned until the following business day.  We are closed weekends and all major holidays.  You do have access to a nurse 24-7, just call the main number to the clinic [phone] and do not press any options, hold on the line and a nurse will answer the phone.    For prescription refill requests, have your pharmacy contact our office and allow 72 hours.    Masks are no longer required in the cancer centers. If you would like for your care team to wear a mask while they are taking care of you, please let them know. You may have one support person who is at least 72 years old accompany you for your appointments.

## 2023-05-19 MED FILL — Iron Dextran Inj 50 MG/ML (Elemental Iron): INTRAMUSCULAR | Qty: 1 | Status: AC

## 2023-05-20 ENCOUNTER — Inpatient Hospital Stay: Payer: Medicare PPO

## 2023-05-20 DIAGNOSIS — J0101 Acute recurrent maxillary sinusitis: Secondary | ICD-10-CM | POA: Diagnosis not present

## 2023-05-20 DIAGNOSIS — R030 Elevated blood-pressure reading, without diagnosis of hypertension: Secondary | ICD-10-CM | POA: Diagnosis not present

## 2023-05-20 DIAGNOSIS — Z6841 Body Mass Index (BMI) 40.0 and over, adult: Secondary | ICD-10-CM | POA: Diagnosis not present

## 2023-05-20 DIAGNOSIS — D509 Iron deficiency anemia, unspecified: Secondary | ICD-10-CM

## 2023-06-08 ENCOUNTER — Inpatient Hospital Stay: Payer: Medicare PPO | Attending: Hematology

## 2023-06-08 VITALS — BP 169/81 | HR 74 | Temp 97.9°F | Resp 18

## 2023-06-08 DIAGNOSIS — N189 Chronic kidney disease, unspecified: Secondary | ICD-10-CM | POA: Diagnosis not present

## 2023-06-08 DIAGNOSIS — D509 Iron deficiency anemia, unspecified: Secondary | ICD-10-CM | POA: Diagnosis not present

## 2023-06-08 MED ORDER — METHYLPREDNISOLONE SODIUM SUCC 125 MG IJ SOLR
125.0000 mg | Freq: Once | INTRAMUSCULAR | Status: AC
  Administered 2023-06-08: 125 mg via INTRAVENOUS
  Filled 2023-06-08: qty 2

## 2023-06-08 MED ORDER — SODIUM CHLORIDE 0.9 % IV SOLN
950.0000 mg | Freq: Once | INTRAVENOUS | Status: AC
  Administered 2023-06-08: 950 mg via INTRAVENOUS
  Filled 2023-06-08: qty 19

## 2023-06-08 MED ORDER — FAMOTIDINE IN NACL 20-0.9 MG/50ML-% IV SOLN
20.0000 mg | Freq: Once | INTRAVENOUS | Status: AC
  Administered 2023-06-08: 20 mg via INTRAVENOUS
  Filled 2023-06-08: qty 50

## 2023-06-08 MED ORDER — ACETAMINOPHEN 325 MG PO TABS
650.0000 mg | ORAL_TABLET | Freq: Once | ORAL | Status: AC
  Administered 2023-06-08: 650 mg via ORAL
  Filled 2023-06-08: qty 2

## 2023-06-08 MED ORDER — CETIRIZINE HCL 10 MG PO TABS
10.0000 mg | ORAL_TABLET | Freq: Once | ORAL | Status: AC
  Administered 2023-06-08: 10 mg via ORAL
  Filled 2023-06-08: qty 1

## 2023-06-08 MED ORDER — SODIUM CHLORIDE 0.9 % IV SOLN: Freq: Once | INTRAVENOUS | Status: AC

## 2023-06-08 MED ORDER — SODIUM CHLORIDE 0.9 % IV SOLN
50.0000 mg | Freq: Once | INTRAVENOUS | Status: AC
  Administered 2023-06-08: 50 mg via INTRAVENOUS
  Filled 2023-06-08 (×2): qty 1

## 2023-06-08 NOTE — Progress Notes (Addendum)
Patient presents today for iron infusion. Patient is in satisfactory condition with no new complaints voiced.  Vital signs are stable.  We will proceed with infusion per provider orders.    Peripheral IV started with good blood return pre and post infusion.  Infed given today per MD orders. Tolerated infusion without adverse affects. Vital signs stable. No complaints at this time. Discharged from clinic ambulatory in stable condition. Alert and oriented x 3. F/U with [facility] as scheduled.   

## 2023-06-08 NOTE — Patient Instructions (Signed)
[facility]  Discharge Instructions: Thank you for choosing [facility] to provide your oncology and hematology care.  If you have a lab appointment with the Cancer Center - please note that after April 8th, 2024, all labs will be drawn in the cancer center.  You do not have to check in or register with the main entrance as you have in the past but will complete your check-in in the cancer center.  Wear comfortable clothing and clothing appropriate for easy access to any Portacath or PICC line.   We strive to give you quality time with your provider. You may need to reschedule your appointment if you arrive late (15 or more minutes).  Arriving late affects you and other patients whose appointments are after yours.  Also, if you miss three or more appointments without notifying the office, you may be dismissed from the clinic at the provider's discretion.      For prescription refill requests, have your pharmacy contact our office and allow 72 hours for refills to be completed.    Today you received Infed IV iron infusion.     BELOW ARE SYMPTOMS THAT SHOULD BE REPORTED IMMEDIATELY: *FEVER GREATER THAN 100.4 F (38 C) OR HIGHER *CHILLS OR SWEATING *NAUSEA AND VOMITING THAT IS NOT CONTROLLED WITH YOUR NAUSEA MEDICATION *UNUSUAL SHORTNESS OF BREATH *UNUSUAL BRUISING OR BLEEDING *URINARY PROBLEMS (pain or burning when urinating, or frequent urination) *BOWEL PROBLEMS (unusual diarrhea, constipation, pain near the anus) TENDERNESS IN MOUTH AND THROAT WITH OR WITHOUT PRESENCE OF ULCERS (sore throat, sores in mouth, or a toothache) UNUSUAL RASH, SWELLING OR PAIN  UNUSUAL VAGINAL DISCHARGE OR ITCHING   Items with * indicate a potential emergency and should be followed up as soon as possible or go to the Emergency Department if any problems should occur.  Please show the CHEMOTHERAPY ALERT CARD or IMMUNOTHERAPY ALERT CARD at check-in to the Emergency Department  and triage nurse.  Should you have questions after your visit or need to cancel or reschedule your appointment, please contact [facility] AT  [phone]  and follow the prompts.  Office hours are 8:00 a.m. to 4:30 p.m. Monday - Friday. Please note that voicemails left after 4:00 p.m. may not be returned until the following business day.  We are closed weekends and major holidays. You have access to a nurse at all times for urgent questions. Please call the main number to the clinic [phone] and follow the prompts.  For any non-urgent questions, you may also contact your provider using MyChart. We now offer e-Visits for anyone 18 and older to request care online for non-urgent symptoms. For details visit [email or web address].   Also download the MyChart app! Go to the app store, search "MyChart", open the app, select [facility], and log in with your MyChart username and password.   

## 2023-07-08 DIAGNOSIS — L400 Psoriasis vulgaris: Secondary | ICD-10-CM | POA: Diagnosis not present

## 2023-07-19 ENCOUNTER — Inpatient Hospital Stay: Payer: Medicare PPO | Attending: Hematology

## 2023-07-27 ENCOUNTER — Inpatient Hospital Stay: Payer: Medicare PPO | Admitting: Oncology

## 2023-08-06 DIAGNOSIS — E875 Hyperkalemia: Secondary | ICD-10-CM | POA: Diagnosis not present

## 2023-08-06 DIAGNOSIS — D529 Folate deficiency anemia, unspecified: Secondary | ICD-10-CM | POA: Diagnosis not present

## 2023-08-06 DIAGNOSIS — R5382 Chronic fatigue, unspecified: Secondary | ICD-10-CM | POA: Diagnosis not present

## 2023-08-06 DIAGNOSIS — I10 Essential (primary) hypertension: Secondary | ICD-10-CM | POA: Diagnosis not present

## 2023-08-06 DIAGNOSIS — Z1329 Encounter for screening for other suspected endocrine disorder: Secondary | ICD-10-CM | POA: Diagnosis not present

## 2023-08-06 DIAGNOSIS — E7849 Other hyperlipidemia: Secondary | ICD-10-CM | POA: Diagnosis not present

## 2023-08-06 DIAGNOSIS — E782 Mixed hyperlipidemia: Secondary | ICD-10-CM | POA: Diagnosis not present

## 2023-08-06 DIAGNOSIS — E1165 Type 2 diabetes mellitus with hyperglycemia: Secondary | ICD-10-CM | POA: Diagnosis not present

## 2023-08-06 DIAGNOSIS — D649 Anemia, unspecified: Secondary | ICD-10-CM | POA: Diagnosis not present

## 2023-08-06 DIAGNOSIS — D519 Vitamin B12 deficiency anemia, unspecified: Secondary | ICD-10-CM | POA: Diagnosis not present

## 2023-08-12 DIAGNOSIS — E1165 Type 2 diabetes mellitus with hyperglycemia: Secondary | ICD-10-CM | POA: Diagnosis not present

## 2023-08-12 DIAGNOSIS — D649 Anemia, unspecified: Secondary | ICD-10-CM | POA: Diagnosis not present

## 2023-08-12 DIAGNOSIS — G4700 Insomnia, unspecified: Secondary | ICD-10-CM | POA: Diagnosis not present

## 2023-08-12 DIAGNOSIS — Z1331 Encounter for screening for depression: Secondary | ICD-10-CM | POA: Diagnosis not present

## 2023-08-12 DIAGNOSIS — M1712 Unilateral primary osteoarthritis, left knee: Secondary | ICD-10-CM | POA: Diagnosis not present

## 2023-08-12 DIAGNOSIS — L409 Psoriasis, unspecified: Secondary | ICD-10-CM | POA: Diagnosis not present

## 2023-08-12 DIAGNOSIS — Z6841 Body Mass Index (BMI) 40.0 and over, adult: Secondary | ICD-10-CM | POA: Diagnosis not present

## 2023-08-12 DIAGNOSIS — M109 Gout, unspecified: Secondary | ICD-10-CM | POA: Diagnosis not present

## 2023-08-12 DIAGNOSIS — I10 Essential (primary) hypertension: Secondary | ICD-10-CM | POA: Diagnosis not present

## 2023-08-27 DIAGNOSIS — E1165 Type 2 diabetes mellitus with hyperglycemia: Secondary | ICD-10-CM | POA: Diagnosis not present

## 2023-08-27 DIAGNOSIS — M109 Gout, unspecified: Secondary | ICD-10-CM | POA: Diagnosis not present

## 2023-08-27 DIAGNOSIS — E782 Mixed hyperlipidemia: Secondary | ICD-10-CM | POA: Diagnosis not present

## 2023-08-27 DIAGNOSIS — G9009 Other idiopathic peripheral autonomic neuropathy: Secondary | ICD-10-CM | POA: Diagnosis not present

## 2023-09-30 DIAGNOSIS — L400 Psoriasis vulgaris: Secondary | ICD-10-CM | POA: Diagnosis not present

## 2023-11-19 DIAGNOSIS — R030 Elevated blood-pressure reading, without diagnosis of hypertension: Secondary | ICD-10-CM | POA: Diagnosis not present

## 2023-11-19 DIAGNOSIS — J209 Acute bronchitis, unspecified: Secondary | ICD-10-CM | POA: Diagnosis not present

## 2023-11-19 DIAGNOSIS — J0101 Acute recurrent maxillary sinusitis: Secondary | ICD-10-CM | POA: Diagnosis not present

## 2023-11-19 DIAGNOSIS — Z6841 Body Mass Index (BMI) 40.0 and over, adult: Secondary | ICD-10-CM | POA: Diagnosis not present

## 2023-12-09 DIAGNOSIS — E1165 Type 2 diabetes mellitus with hyperglycemia: Secondary | ICD-10-CM | POA: Diagnosis not present

## 2023-12-09 DIAGNOSIS — I10 Essential (primary) hypertension: Secondary | ICD-10-CM | POA: Diagnosis not present

## 2023-12-09 DIAGNOSIS — D529 Folate deficiency anemia, unspecified: Secondary | ICD-10-CM | POA: Diagnosis not present

## 2023-12-09 DIAGNOSIS — D519 Vitamin B12 deficiency anemia, unspecified: Secondary | ICD-10-CM | POA: Diagnosis not present

## 2023-12-09 DIAGNOSIS — D649 Anemia, unspecified: Secondary | ICD-10-CM | POA: Diagnosis not present

## 2023-12-09 DIAGNOSIS — E782 Mixed hyperlipidemia: Secondary | ICD-10-CM | POA: Diagnosis not present

## 2023-12-09 DIAGNOSIS — Z1329 Encounter for screening for other suspected endocrine disorder: Secondary | ICD-10-CM | POA: Diagnosis not present

## 2023-12-09 DIAGNOSIS — E875 Hyperkalemia: Secondary | ICD-10-CM | POA: Diagnosis not present

## 2023-12-09 DIAGNOSIS — E7849 Other hyperlipidemia: Secondary | ICD-10-CM | POA: Diagnosis not present

## 2023-12-15 DIAGNOSIS — L409 Psoriasis, unspecified: Secondary | ICD-10-CM | POA: Diagnosis not present

## 2023-12-15 DIAGNOSIS — G4700 Insomnia, unspecified: Secondary | ICD-10-CM | POA: Diagnosis not present

## 2023-12-15 DIAGNOSIS — Z6841 Body Mass Index (BMI) 40.0 and over, adult: Secondary | ICD-10-CM | POA: Diagnosis not present

## 2023-12-15 DIAGNOSIS — I10 Essential (primary) hypertension: Secondary | ICD-10-CM | POA: Diagnosis not present

## 2023-12-15 DIAGNOSIS — E1165 Type 2 diabetes mellitus with hyperglycemia: Secondary | ICD-10-CM | POA: Diagnosis not present

## 2023-12-15 DIAGNOSIS — E7849 Other hyperlipidemia: Secondary | ICD-10-CM | POA: Diagnosis not present

## 2023-12-15 DIAGNOSIS — M109 Gout, unspecified: Secondary | ICD-10-CM | POA: Diagnosis not present

## 2024-01-13 DIAGNOSIS — L400 Psoriasis vulgaris: Secondary | ICD-10-CM | POA: Diagnosis not present

## 2024-02-21 DIAGNOSIS — R051 Acute cough: Secondary | ICD-10-CM | POA: Diagnosis not present

## 2024-02-21 DIAGNOSIS — Z6841 Body Mass Index (BMI) 40.0 and over, adult: Secondary | ICD-10-CM | POA: Diagnosis not present

## 2024-02-21 DIAGNOSIS — J019 Acute sinusitis, unspecified: Secondary | ICD-10-CM | POA: Diagnosis not present

## 2024-03-24 DIAGNOSIS — M79642 Pain in left hand: Secondary | ICD-10-CM | POA: Diagnosis not present

## 2024-03-24 DIAGNOSIS — Z6841 Body Mass Index (BMI) 40.0 and over, adult: Secondary | ICD-10-CM | POA: Diagnosis not present

## 2024-03-24 DIAGNOSIS — J019 Acute sinusitis, unspecified: Secondary | ICD-10-CM | POA: Diagnosis not present

## 2024-03-24 DIAGNOSIS — R051 Acute cough: Secondary | ICD-10-CM | POA: Diagnosis not present

## 2024-03-27 DIAGNOSIS — E782 Mixed hyperlipidemia: Secondary | ICD-10-CM | POA: Diagnosis not present

## 2024-03-27 DIAGNOSIS — E1165 Type 2 diabetes mellitus with hyperglycemia: Secondary | ICD-10-CM | POA: Diagnosis not present

## 2024-03-27 DIAGNOSIS — I10 Essential (primary) hypertension: Secondary | ICD-10-CM | POA: Diagnosis not present

## 2024-04-12 DIAGNOSIS — D529 Folate deficiency anemia, unspecified: Secondary | ICD-10-CM | POA: Diagnosis not present

## 2024-04-12 DIAGNOSIS — E1165 Type 2 diabetes mellitus with hyperglycemia: Secondary | ICD-10-CM | POA: Diagnosis not present

## 2024-04-12 DIAGNOSIS — D649 Anemia, unspecified: Secondary | ICD-10-CM | POA: Diagnosis not present

## 2024-04-12 DIAGNOSIS — I10 Essential (primary) hypertension: Secondary | ICD-10-CM | POA: Diagnosis not present

## 2024-04-12 DIAGNOSIS — E7849 Other hyperlipidemia: Secondary | ICD-10-CM | POA: Diagnosis not present

## 2024-04-12 DIAGNOSIS — E782 Mixed hyperlipidemia: Secondary | ICD-10-CM | POA: Diagnosis not present

## 2024-04-12 DIAGNOSIS — Z1329 Encounter for screening for other suspected endocrine disorder: Secondary | ICD-10-CM | POA: Diagnosis not present

## 2024-04-13 DIAGNOSIS — L400 Psoriasis vulgaris: Secondary | ICD-10-CM | POA: Diagnosis not present

## 2024-04-19 DIAGNOSIS — M79642 Pain in left hand: Secondary | ICD-10-CM | POA: Diagnosis not present

## 2024-04-19 DIAGNOSIS — E1165 Type 2 diabetes mellitus with hyperglycemia: Secondary | ICD-10-CM | POA: Diagnosis not present

## 2024-04-19 DIAGNOSIS — Z1389 Encounter for screening for other disorder: Secondary | ICD-10-CM | POA: Diagnosis not present

## 2024-04-19 DIAGNOSIS — Z117 Encounter for testing for latent tuberculosis infection: Secondary | ICD-10-CM | POA: Diagnosis not present

## 2024-04-19 DIAGNOSIS — E782 Mixed hyperlipidemia: Secondary | ICD-10-CM | POA: Diagnosis not present

## 2024-04-19 DIAGNOSIS — I10 Essential (primary) hypertension: Secondary | ICD-10-CM | POA: Diagnosis not present

## 2024-04-19 DIAGNOSIS — D649 Anemia, unspecified: Secondary | ICD-10-CM | POA: Diagnosis not present

## 2024-04-19 DIAGNOSIS — Z111 Encounter for screening for respiratory tuberculosis: Secondary | ICD-10-CM | POA: Diagnosis not present

## 2024-04-19 DIAGNOSIS — Z1331 Encounter for screening for depression: Secondary | ICD-10-CM | POA: Diagnosis not present

## 2024-04-19 DIAGNOSIS — L4052 Psoriatic arthritis mutilans: Secondary | ICD-10-CM | POA: Diagnosis not present

## 2024-04-19 DIAGNOSIS — Z0001 Encounter for general adult medical examination with abnormal findings: Secondary | ICD-10-CM | POA: Diagnosis not present

## 2024-04-19 DIAGNOSIS — Z0000 Encounter for general adult medical examination without abnormal findings: Secondary | ICD-10-CM | POA: Diagnosis not present

## 2024-04-26 DIAGNOSIS — E1165 Type 2 diabetes mellitus with hyperglycemia: Secondary | ICD-10-CM | POA: Diagnosis not present

## 2024-04-26 DIAGNOSIS — I10 Essential (primary) hypertension: Secondary | ICD-10-CM | POA: Diagnosis not present

## 2024-04-26 DIAGNOSIS — E782 Mixed hyperlipidemia: Secondary | ICD-10-CM | POA: Diagnosis not present

## 2024-05-26 DIAGNOSIS — E1165 Type 2 diabetes mellitus with hyperglycemia: Secondary | ICD-10-CM | POA: Diagnosis not present

## 2024-05-26 DIAGNOSIS — E782 Mixed hyperlipidemia: Secondary | ICD-10-CM | POA: Diagnosis not present

## 2024-05-26 DIAGNOSIS — I10 Essential (primary) hypertension: Secondary | ICD-10-CM | POA: Diagnosis not present

## 2024-06-14 DIAGNOSIS — H52223 Regular astigmatism, bilateral: Secondary | ICD-10-CM | POA: Diagnosis not present

## 2024-06-14 DIAGNOSIS — E119 Type 2 diabetes mellitus without complications: Secondary | ICD-10-CM | POA: Diagnosis not present

## 2024-06-14 DIAGNOSIS — H25813 Combined forms of age-related cataract, bilateral: Secondary | ICD-10-CM | POA: Diagnosis not present

## 2024-06-14 DIAGNOSIS — H524 Presbyopia: Secondary | ICD-10-CM | POA: Diagnosis not present

## 2024-06-14 DIAGNOSIS — H5203 Hypermetropia, bilateral: Secondary | ICD-10-CM | POA: Diagnosis not present

## 2024-06-26 DIAGNOSIS — E1165 Type 2 diabetes mellitus with hyperglycemia: Secondary | ICD-10-CM | POA: Diagnosis not present

## 2024-06-26 DIAGNOSIS — E782 Mixed hyperlipidemia: Secondary | ICD-10-CM | POA: Diagnosis not present

## 2024-06-26 DIAGNOSIS — I10 Essential (primary) hypertension: Secondary | ICD-10-CM | POA: Diagnosis not present

## 2024-07-13 DIAGNOSIS — L400 Psoriasis vulgaris: Secondary | ICD-10-CM | POA: Diagnosis not present

## 2024-07-27 DIAGNOSIS — E782 Mixed hyperlipidemia: Secondary | ICD-10-CM | POA: Diagnosis not present

## 2024-07-27 DIAGNOSIS — I10 Essential (primary) hypertension: Secondary | ICD-10-CM | POA: Diagnosis not present

## 2024-07-27 DIAGNOSIS — E1165 Type 2 diabetes mellitus with hyperglycemia: Secondary | ICD-10-CM | POA: Diagnosis not present

## 2024-08-16 DIAGNOSIS — R5382 Chronic fatigue, unspecified: Secondary | ICD-10-CM | POA: Diagnosis not present

## 2024-08-16 DIAGNOSIS — E1165 Type 2 diabetes mellitus with hyperglycemia: Secondary | ICD-10-CM | POA: Diagnosis not present

## 2024-08-16 DIAGNOSIS — Z1329 Encounter for screening for other suspected endocrine disorder: Secondary | ICD-10-CM | POA: Diagnosis not present

## 2024-08-16 DIAGNOSIS — E875 Hyperkalemia: Secondary | ICD-10-CM | POA: Diagnosis not present

## 2024-08-16 DIAGNOSIS — D649 Anemia, unspecified: Secondary | ICD-10-CM | POA: Diagnosis not present

## 2024-08-16 DIAGNOSIS — D519 Vitamin B12 deficiency anemia, unspecified: Secondary | ICD-10-CM | POA: Diagnosis not present

## 2024-08-16 DIAGNOSIS — E7849 Other hyperlipidemia: Secondary | ICD-10-CM | POA: Diagnosis not present

## 2024-08-16 DIAGNOSIS — D529 Folate deficiency anemia, unspecified: Secondary | ICD-10-CM | POA: Diagnosis not present

## 2024-08-23 DIAGNOSIS — E782 Mixed hyperlipidemia: Secondary | ICD-10-CM | POA: Diagnosis not present

## 2024-08-23 DIAGNOSIS — D649 Anemia, unspecified: Secondary | ICD-10-CM | POA: Diagnosis not present

## 2024-08-23 DIAGNOSIS — Z6841 Body Mass Index (BMI) 40.0 and over, adult: Secondary | ICD-10-CM | POA: Diagnosis not present

## 2024-08-23 DIAGNOSIS — E7849 Other hyperlipidemia: Secondary | ICD-10-CM | POA: Diagnosis not present

## 2024-08-23 DIAGNOSIS — I10 Essential (primary) hypertension: Secondary | ICD-10-CM | POA: Diagnosis not present

## 2024-08-23 DIAGNOSIS — E1165 Type 2 diabetes mellitus with hyperglycemia: Secondary | ICD-10-CM | POA: Diagnosis not present

## 2024-08-25 DIAGNOSIS — I10 Essential (primary) hypertension: Secondary | ICD-10-CM | POA: Diagnosis not present

## 2024-08-25 DIAGNOSIS — E1165 Type 2 diabetes mellitus with hyperglycemia: Secondary | ICD-10-CM | POA: Diagnosis not present

## 2024-08-25 DIAGNOSIS — E782 Mixed hyperlipidemia: Secondary | ICD-10-CM | POA: Diagnosis not present

## 2024-09-26 DIAGNOSIS — I10 Essential (primary) hypertension: Secondary | ICD-10-CM | POA: Diagnosis not present

## 2024-09-26 DIAGNOSIS — E1165 Type 2 diabetes mellitus with hyperglycemia: Secondary | ICD-10-CM | POA: Diagnosis not present

## 2024-09-26 DIAGNOSIS — E782 Mixed hyperlipidemia: Secondary | ICD-10-CM | POA: Diagnosis not present

## 2024-10-12 DIAGNOSIS — L578 Other skin changes due to chronic exposure to nonionizing radiation: Secondary | ICD-10-CM | POA: Diagnosis not present

## 2024-10-12 DIAGNOSIS — L400 Psoriasis vulgaris: Secondary | ICD-10-CM | POA: Diagnosis not present

## 2024-10-20 DIAGNOSIS — M1990 Unspecified osteoarthritis, unspecified site: Secondary | ICD-10-CM | POA: Diagnosis not present

## 2024-10-20 DIAGNOSIS — E1142 Type 2 diabetes mellitus with diabetic polyneuropathy: Secondary | ICD-10-CM | POA: Diagnosis not present

## 2024-10-20 DIAGNOSIS — N189 Chronic kidney disease, unspecified: Secondary | ICD-10-CM | POA: Diagnosis not present

## 2024-10-20 DIAGNOSIS — I129 Hypertensive chronic kidney disease with stage 1 through stage 4 chronic kidney disease, or unspecified chronic kidney disease: Secondary | ICD-10-CM | POA: Diagnosis not present

## 2024-10-20 DIAGNOSIS — M109 Gout, unspecified: Secondary | ICD-10-CM | POA: Diagnosis not present

## 2024-10-20 DIAGNOSIS — E1122 Type 2 diabetes mellitus with diabetic chronic kidney disease: Secondary | ICD-10-CM | POA: Diagnosis not present

## 2024-10-20 DIAGNOSIS — Z87891 Personal history of nicotine dependence: Secondary | ICD-10-CM | POA: Diagnosis not present

## 2024-10-20 DIAGNOSIS — E785 Hyperlipidemia, unspecified: Secondary | ICD-10-CM | POA: Diagnosis not present

## 2024-10-27 DIAGNOSIS — E1165 Type 2 diabetes mellitus with hyperglycemia: Secondary | ICD-10-CM | POA: Diagnosis not present

## 2024-10-27 DIAGNOSIS — E782 Mixed hyperlipidemia: Secondary | ICD-10-CM | POA: Diagnosis not present

## 2024-10-27 DIAGNOSIS — I10 Essential (primary) hypertension: Secondary | ICD-10-CM | POA: Diagnosis not present
